# Patient Record
Sex: Female | Born: 1960
Health system: Southern US, Community
[De-identification: ages and names within clinical notes are randomized; demographics above are authoritative.]

## PROBLEM LIST (undated history)

## (undated) DIAGNOSIS — F39 Unspecified mood [affective] disorder: Secondary | ICD-10-CM

## (undated) DIAGNOSIS — F32A Depression, unspecified: Secondary | ICD-10-CM

## (undated) DIAGNOSIS — E049 Nontoxic goiter, unspecified: Secondary | ICD-10-CM

## (undated) HISTORY — DX: Unspecified mood (affective) disorder: F39

## (undated) HISTORY — DX: Nontoxic goiter, unspecified: E04.9

---

## 2000-09-14 ENCOUNTER — Other Ambulatory Visit: Admission: RE | Admit: 2000-09-14 | Discharge: 2000-09-14 | Payer: Self-pay | Admitting: Obstetrics and Gynecology

## 2002-02-22 ENCOUNTER — Encounter: Admission: RE | Admit: 2002-02-22 | Discharge: 2002-02-22 | Payer: Self-pay | Admitting: Internal Medicine

## 2002-02-22 ENCOUNTER — Encounter: Payer: Self-pay | Admitting: Internal Medicine

## 2003-12-16 HISTORY — PX: BUNIONECTOMY: SHX129

## 2004-05-03 ENCOUNTER — Encounter: Admission: RE | Admit: 2004-05-03 | Discharge: 2004-05-03 | Payer: Self-pay | Admitting: Endocrinology

## 2004-12-26 ENCOUNTER — Ambulatory Visit (HOSPITAL_BASED_OUTPATIENT_CLINIC_OR_DEPARTMENT_OTHER): Admission: RE | Admit: 2004-12-26 | Discharge: 2004-12-26 | Payer: Self-pay | Admitting: Orthopedic Surgery

## 2008-11-10 ENCOUNTER — Encounter: Admission: RE | Admit: 2008-11-10 | Discharge: 2008-11-10 | Payer: Self-pay | Admitting: Obstetrics & Gynecology

## 2008-12-20 ENCOUNTER — Encounter: Admission: RE | Admit: 2008-12-20 | Discharge: 2008-12-20 | Payer: Self-pay | Admitting: Cardiology

## 2008-12-22 HISTORY — PX: OTHER SURGICAL HISTORY: SHX169

## 2011-01-05 ENCOUNTER — Encounter: Payer: Self-pay | Admitting: Endocrinology

## 2011-05-02 NOTE — Op Note (Signed)
NAMEAMARISSA, KOERNER                ACCOUNT NO.:  [id number]   MEDICAL RECORD NO.:  [id number]          PATIENT TYPE:  AMB   LOCATION:  DSC                          FACILITY:  [facility]   PHYSICIAN:  Rodney A. 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   OPERATION:  Dome osteotomy base first metatarsal, realignment first  metatarsal; removal exostosis, great toe metatarsophalangeal joint, left  foot.   ANESTHESIA:  General.   SURGEON:  Rodney A. Chaney Malling, M.D.   PROCEDURE:  The patient was placed on the operating table in the supine  position.  A pneumatic tourniquet was placed on the left upper thigh.  The  left lower extremity was prepped with DuraPrep and draped out in the usual  manner.  The leg was wrapped with an Esmarch.  The tourniquet was elevated.  An incision was made over the great toe MP joint on the medial side.  Skin  edges were retracted.  Great care was taken to avoid injury to the  superficial cutaneous nerves.  A distally based flap of the capsule was  made, and the capsule was thinned out.  This exposed a very large exostosis.  The exostosis was removed with a power saw, and a rongeur was used to clean  the edges.  Excellent decompression of the exostosis was achieved.  The MP  joint was then irrigated with copious amounts of saline solution and removed  all the bone dust.   Attention was then turned to the base of the first metatarsal.  On the C-arm  magnification, the first metatarsal and the first cuneiform articulation was  identified and isolated.  The needle was put in the articulation.  The  proximal metaphyseal area of the first  metatarsal was then exposed.  Care  was taken to avoid injury to the superficial cutaneous nerves.  Using a dome  osteotomy saw, an osteotomy was made.  The first metatarsal was then brought  parallel to the second and third metatarsals and was held in this position,  and under C-arm control two large Steinmann pins were placed across the  osteotomy site to stabilize the osteotomy site.  Excellent realignment was  achieved.  The first metatarsal was now parallel to the second and third  metatarsals.  The exostosis which was removed previously had articular  cartilage removed.  This was ground up and used as a bone graft at the  osteotomy site.  The capsule of the great toe MP joint was then closed with  3-0 Vicryl.  4-0 nylon was then used to close the skin.  Sterile dressings  were applied.  The area of incision had been infiltrated with Marcaine.  A  large bulky pressure dressing was applied.  The patient returned to the  recovery room in excellent  condition.  Technically, this procedure went  extremely well.   DRAINS:  None.   COMPLICATIONS:  None.   I was very pleased with the surgical outcome.   DISPOSITION:  1.  To return to my office on Monday.  2.  Percocet for pain.  3.  Walking with a wooden shoe, partial weightbearing.       RAM/MEDQ  D:  12/26/2004  T:  12/26/2004  Job:  [id number]

## 2012-04-23 ENCOUNTER — Ambulatory Visit
Admission: RE | Admit: 2012-04-23 | Discharge: 2012-04-23 | Disposition: A | Discharge status: Home / Self Care | Payer: BC Managed Care – PPO | Source: Ambulatory Visit | Attending: Cardiology | Admitting: Cardiology

## 2012-04-23 ENCOUNTER — Other Ambulatory Visit: Payer: Self-pay | Admitting: Cardiology

## 2012-04-23 DIAGNOSIS — F17200 Nicotine dependence, unspecified, uncomplicated: Secondary | ICD-10-CM

## 2013-01-14 LAB — LIPID PANEL
Cholesterol: 244 — AB (ref 0–200)
HDL: 55 (ref 35–70)
LDL Cholesterol: 127
Triglycerides: 313 — AB (ref 40–160)

## 2013-01-14 LAB — BASIC METABOLIC PANEL: Glucose: 99

## 2013-02-25 IMAGING — CR DG CHEST 2V
2 series · 2 of 2 positions shown · non-contrast
Comparison: Chest x-ray of 12/20/2008

CLINICAL DATA: Smoking history, chronic cough

CHEST - 2 VIEW

[view not recorded (1 of 2)]
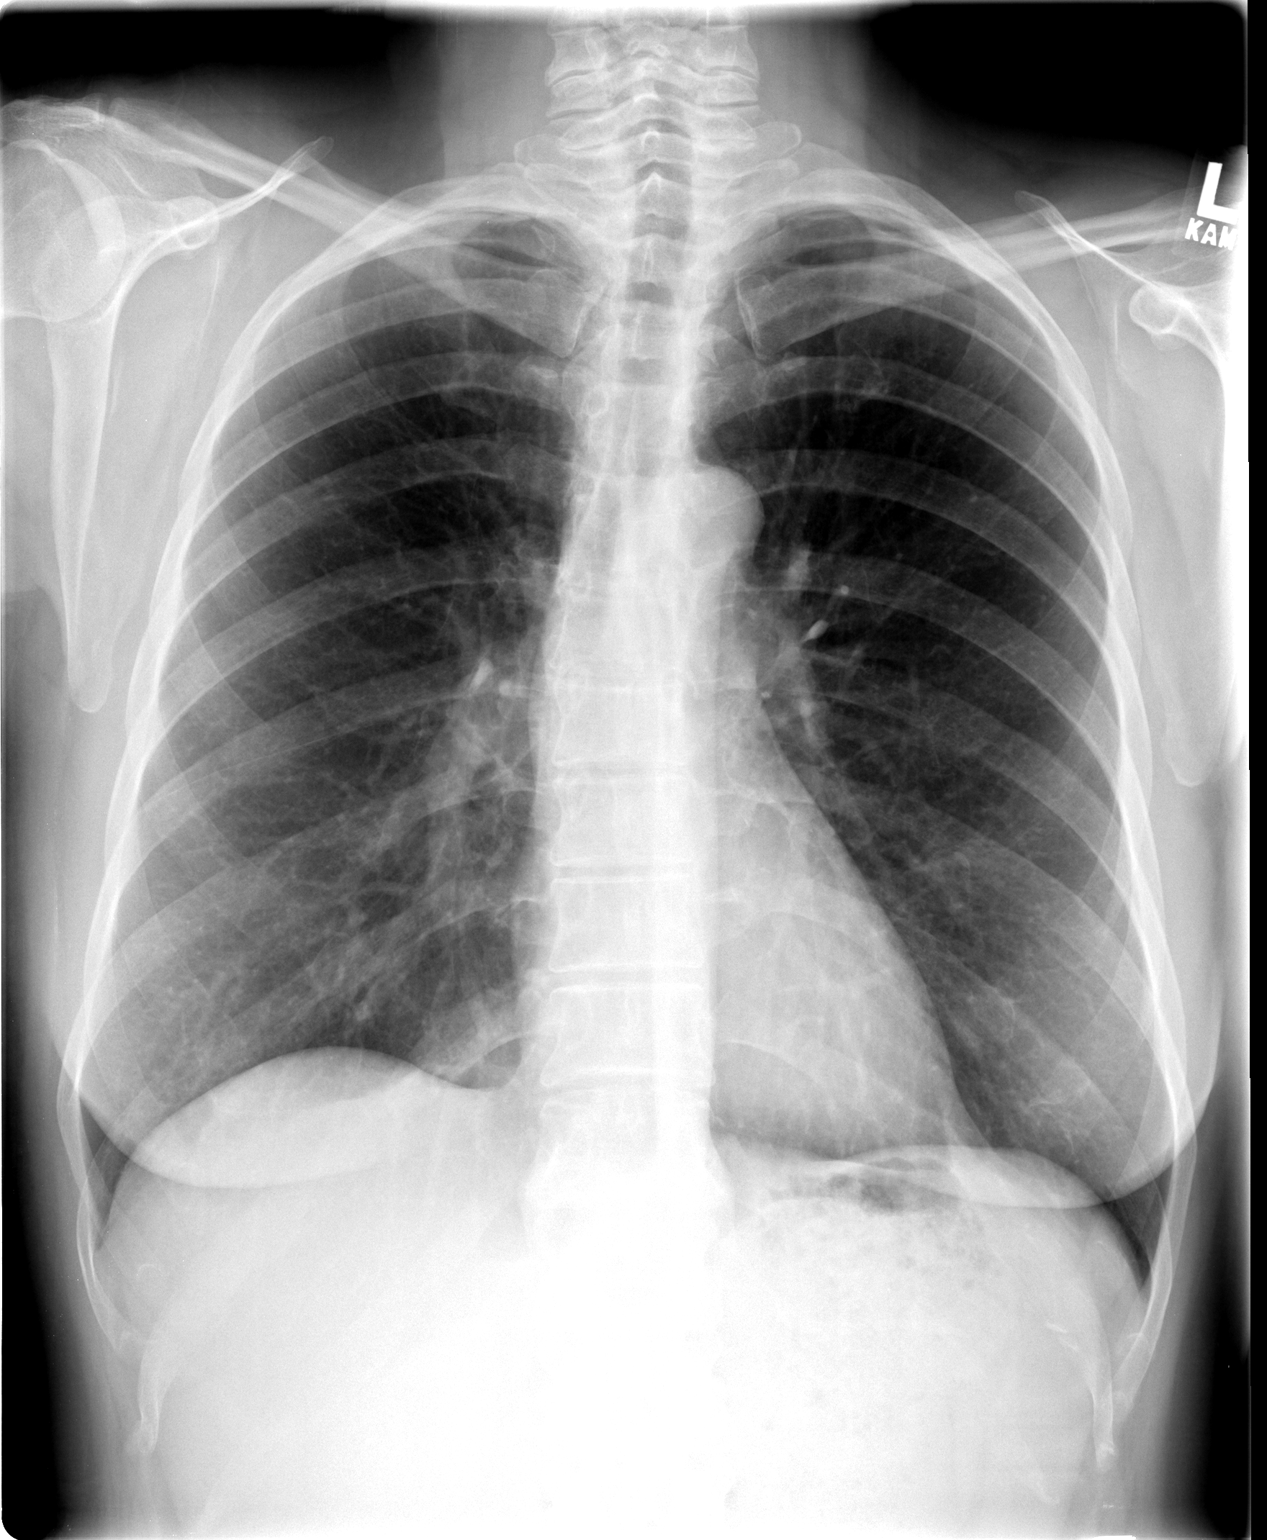

[view not recorded (2 of 2)]
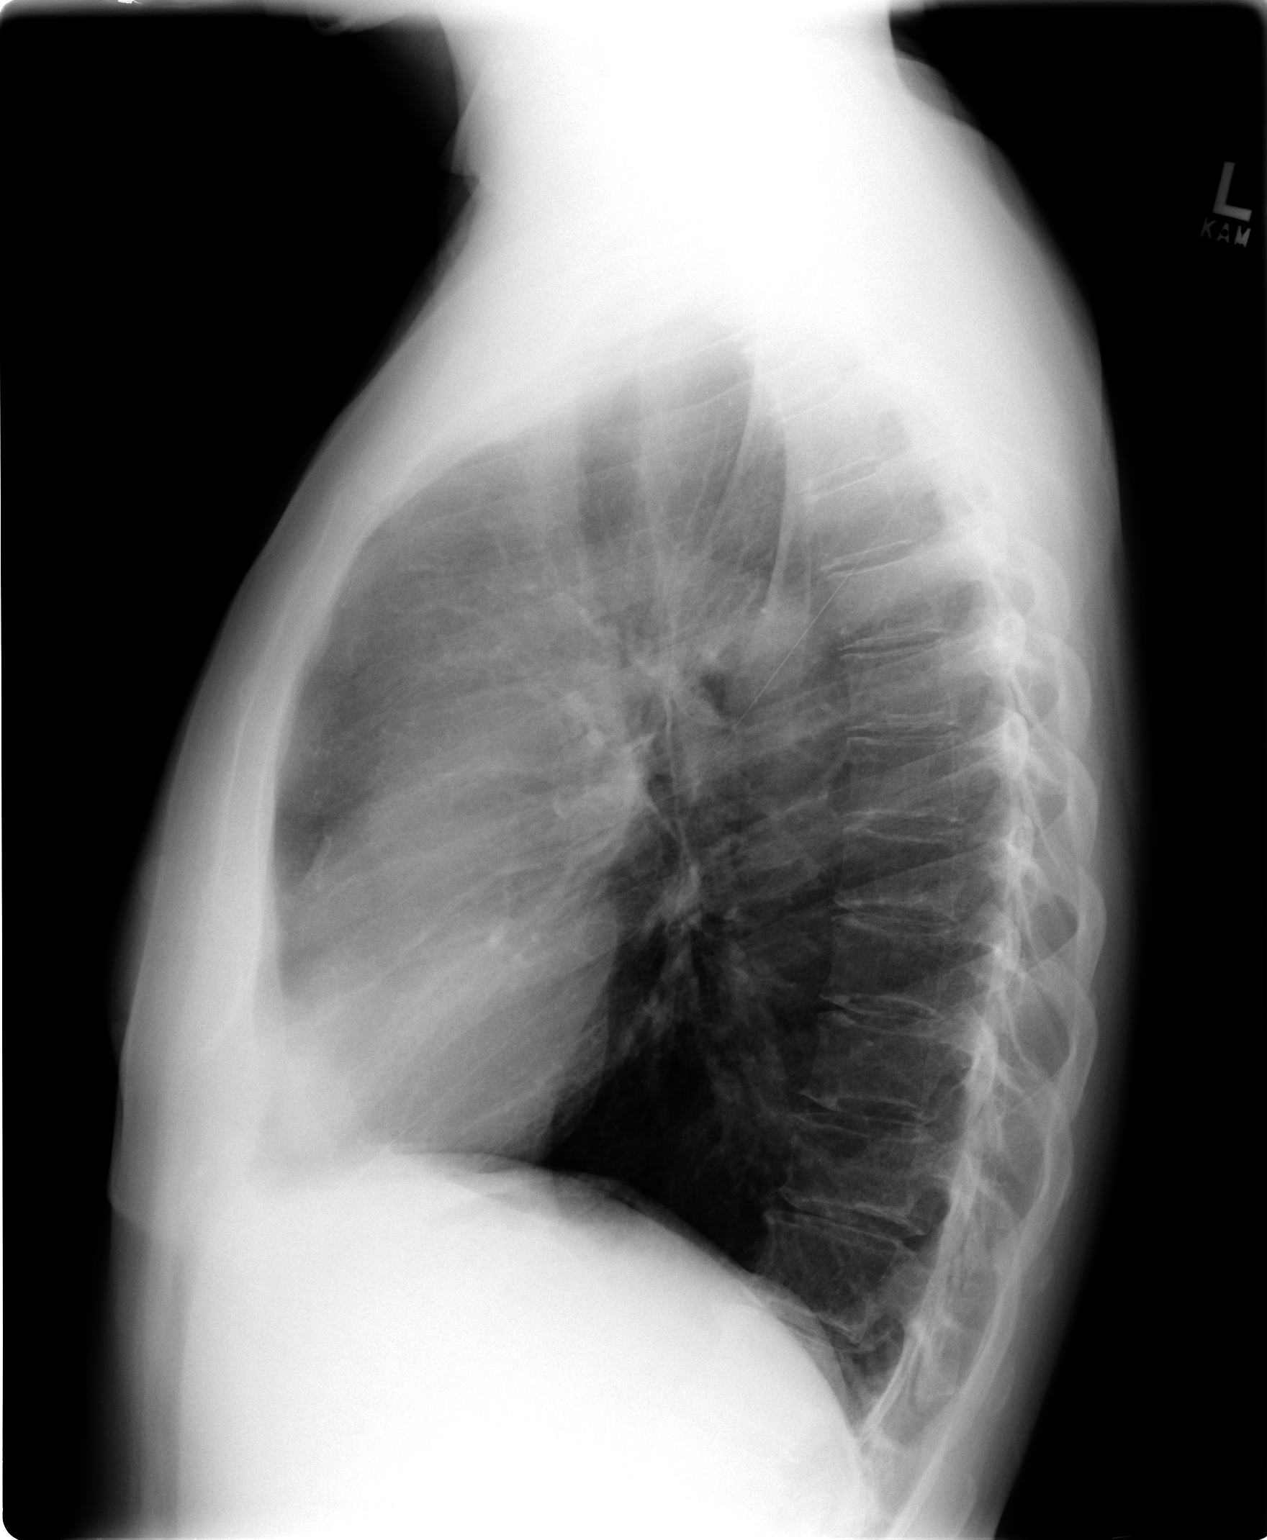

[2 of 2 positions shown; findings below may reference images not displayed]

FINDINGS: The lungs are clear but hyperaerated.  Mediastinal
contours appear stable.  The heart is within normal limits in size.
No bony abnormality is seen.
IMPRESSION: No active lung disease.  No change in slight hyperaeration.

## 2013-02-28 ENCOUNTER — Encounter: Payer: Self-pay | Admitting: *Deleted

## 2013-03-09 ENCOUNTER — Encounter: Payer: Self-pay | Admitting: Cardiology

## 2014-08-14 LAB — LIPID PANEL
Cholesterol: 205 — AB (ref 0–200)
HDL: 43 (ref 35–70)
LDL Cholesterol: 132
LDl/HDL Ratio: 4.8
Triglycerides: 148 (ref 40–160)

## 2014-08-14 LAB — HEMOGLOBIN A1C: HbA1c POC (<> result, manual entry): 5.4 % (ref 4.0–5.6)

## 2014-08-14 LAB — HEPATIC FUNCTION PANEL
ALT: 32 (ref 7–35)
AST: 21 (ref 13–35)
Alkaline Phosphatase: 61 (ref 25–125)
Bilirubin, Total: 0.4

## 2014-08-14 LAB — BASIC METABOLIC PANEL
BUN: 12 (ref 4–21)
Creatinine: 0.8 (ref 0.5–1.1)
Glucose: 101
Potassium: 4.4 (ref 3.4–5.3)
Sodium: 140 (ref 137–147)

## 2014-08-14 LAB — VITAMIN D 25 HYDROXY (VIT D DEFICIENCY, FRACTURES): Vit D, 25-Hydroxy: 55

## 2014-08-14 LAB — TSH: TSH: 1.19 (ref 0.41–5.90)

## 2015-06-13 LAB — LIPID PANEL
Cholesterol: 188 (ref 0–200)
HDL: 38 (ref 35–70)
LDL Cholesterol: 112
LDl/HDL Ratio: 4.9
Triglycerides: 215 — AB (ref 40–160)

## 2015-06-13 LAB — CBC AND DIFFERENTIAL
HCT: 42 (ref 36–46)
Hemoglobin: 14.1 (ref 12.0–16.0)
Neutrophils Absolute: 9
Platelets: 217 (ref 150–399)
WBC: 13

## 2015-06-13 LAB — BASIC METABOLIC PANEL
BUN: 10 (ref 4–21)
Creatinine: 0.8 (ref 0.5–1.1)
Glucose: 93
Potassium: 4.2 (ref 3.4–5.3)
Sodium: 140 (ref 137–147)

## 2015-06-13 LAB — HEPATIC FUNCTION PANEL
ALT: 24 (ref 7–35)
AST: 15 (ref 13–35)
Alkaline Phosphatase: 62 (ref 25–125)
Bilirubin, Total: 0.4

## 2015-06-13 LAB — VITAMIN D 25 HYDROXY (VIT D DEFICIENCY, FRACTURES): Vit D, 25-Hydroxy: 40

## 2015-06-13 LAB — HEMOGLOBIN A1C: Hemoglobin A1C: 5.3

## 2015-07-24 ENCOUNTER — Other Ambulatory Visit: Payer: Self-pay | Admitting: Gastroenterology

## 2015-07-24 DIAGNOSIS — R1013 Epigastric pain: Secondary | ICD-10-CM

## 2015-07-24 DIAGNOSIS — R52 Pain, unspecified: Secondary | ICD-10-CM

## 2015-07-25 ENCOUNTER — Other Ambulatory Visit: Payer: Self-pay | Admitting: Gastroenterology

## 2015-07-25 ENCOUNTER — Ambulatory Visit
Admission: RE | Admit: 2015-07-25 | Discharge: 2015-07-25 | Disposition: A | Discharge status: Home / Self Care | Payer: BLUE CROSS/BLUE SHIELD | Source: Ambulatory Visit | Attending: Gastroenterology | Admitting: Gastroenterology

## 2015-07-25 DIAGNOSIS — R1013 Epigastric pain: Secondary | ICD-10-CM

## 2015-07-26 ENCOUNTER — Other Ambulatory Visit: Payer: Self-pay | Admitting: Gastroenterology

## 2015-07-26 DIAGNOSIS — N289 Disorder of kidney and ureter, unspecified: Secondary | ICD-10-CM

## 2015-08-06 ENCOUNTER — Ambulatory Visit
Admission: RE | Admit: 2015-08-06 | Discharge: 2015-08-06 | Disposition: A | Discharge status: Home / Self Care | Payer: BLUE CROSS/BLUE SHIELD | Source: Ambulatory Visit | Attending: Gastroenterology | Admitting: Gastroenterology

## 2015-08-06 DIAGNOSIS — N289 Disorder of kidney and ureter, unspecified: Secondary | ICD-10-CM

## 2015-08-06 MED ORDER — GADOBENATE DIMEGLUMINE 529 MG/ML IV SOLN
17.0000 mL | Freq: Once | INTRAVENOUS | Status: AC | PRN
Start: 2015-08-06 — End: 2015-08-06
  Administered 2015-08-06: 17 mL via INTRAVENOUS

## 2016-04-25 LAB — BASIC METABOLIC PANEL
BUN: 13 (ref 4–21)
BUN: 13 (ref 4–21)
Creatinine: 0.8 (ref 0.5–1.1)
Creatinine: 0.8 (ref 0.5–1.1)
Glucose: 91
Glucose: 91
Potassium: 4.2 (ref 3.4–5.3)
Sodium: 139 (ref 137–147)
Sodium: 139 (ref 137–147)

## 2016-04-25 LAB — LIPID PANEL
Cholesterol: 195 (ref 0–200)
Cholesterol: 195 (ref 0–200)
HDL: 37 (ref 35–70)
HDL: 37 (ref 35–70)
LDL Cholesterol: 117
LDL Cholesterol: 117
LDl/HDL Ratio: 5.3
LDl/HDL Ratio: 5.3
Triglycerides: 205 — AB (ref 40–160)
Triglycerides: 205 — AB (ref 40–160)

## 2016-04-25 LAB — HEPATIC FUNCTION PANEL
ALT: 28 (ref 7–35)
ALT: 28 (ref 7–35)
AST: 18 (ref 13–35)
AST: 18 (ref 13–35)
Alkaline Phosphatase: 60 (ref 25–125)
Alkaline Phosphatase: 60 (ref 25–125)
Bilirubin, Total: 0.5
Bilirubin, Total: 0.5

## 2016-04-25 LAB — HEMOGLOBIN A1C
HbA1c POC (<> result, manual entry): 5.4 % (ref 4.0–5.6)
Hemoglobin A1C: 5.4

## 2016-04-25 LAB — VITAMIN D 25 HYDROXY (VIT D DEFICIENCY, FRACTURES): Vit D, 25-Hydroxy: 35

## 2016-04-25 LAB — TSH
TSH: 0.91 (ref 0.41–5.90)
TSH: 0.91 (ref 0.41–5.90)

## 2017-09-25 LAB — LIPID PANEL
Cholesterol: 180 (ref 0–200)
HDL: 31 — AB (ref 35–70)
LDL Cholesterol: 117
LDl/HDL Ratio: 3.5
Triglycerides: 198 — AB (ref 40–160)

## 2017-09-25 LAB — CBC AND DIFFERENTIAL
HCT: 42 (ref 36–46)
Hemoglobin: 14.3 (ref 12.0–16.0)
Neutrophils Absolute: 9
Platelets: 241 (ref 150–399)
WBC: 12.2

## 2017-09-25 LAB — HEPATIC FUNCTION PANEL
ALT: 26 (ref 7–35)
AST: 17 (ref 13–35)
Alkaline Phosphatase: 76 (ref 25–125)
Bilirubin, Total: 0.3

## 2017-09-25 LAB — VITAMIN D 25 HYDROXY (VIT D DEFICIENCY, FRACTURES): Vit D, 25-Hydroxy: 43.6

## 2017-09-25 LAB — TSH: TSH: 0.58 (ref 0.41–5.90)

## 2017-09-25 LAB — BASIC METABOLIC PANEL
BUN: 8 (ref 4–21)
Creatinine: 0.9 (ref 0.5–1.1)
Glucose: 101
Potassium: 4.4 (ref 3.4–5.3)
Sodium: 144 (ref 137–147)

## 2017-09-25 LAB — VITAMIN B12: Vitamin B-12: 2000

## 2018-10-05 ENCOUNTER — Encounter: Payer: Self-pay | Admitting: Family Medicine

## 2018-10-12 ENCOUNTER — Encounter: Payer: Self-pay | Admitting: Family Medicine

## 2018-10-12 NOTE — Progress Notes (Signed)
Care Everywhere/thx dmf 

## 2018-10-13 ENCOUNTER — Encounter: Payer: Self-pay | Admitting: Family Medicine

## 2018-10-13 ENCOUNTER — Telehealth: Payer: Self-pay | Admitting: Acute Care

## 2018-10-13 ENCOUNTER — Ambulatory Visit (INDEPENDENT_AMBULATORY_CARE_PROVIDER_SITE_OTHER): Payer: BLUE CROSS/BLUE SHIELD | Admitting: Family Medicine

## 2018-10-13 VITALS — BP 112/70 | HR 65 | Temp 98.6°F | Ht 67.0 in | Wt 173.0 lb

## 2018-10-13 DIAGNOSIS — E2839 Other primary ovarian failure: Secondary | ICD-10-CM

## 2018-10-13 DIAGNOSIS — Z6827 Body mass index (BMI) 27.0-27.9, adult: Secondary | ICD-10-CM

## 2018-10-13 DIAGNOSIS — M810 Age-related osteoporosis without current pathological fracture: Secondary | ICD-10-CM | POA: Diagnosis not present

## 2018-10-13 DIAGNOSIS — R3129 Other microscopic hematuria: Secondary | ICD-10-CM

## 2018-10-13 DIAGNOSIS — Z833 Family history of diabetes mellitus: Secondary | ICD-10-CM | POA: Diagnosis not present

## 2018-10-13 DIAGNOSIS — F17200 Nicotine dependence, unspecified, uncomplicated: Secondary | ICD-10-CM

## 2018-10-13 DIAGNOSIS — Z809 Family history of malignant neoplasm, unspecified: Secondary | ICD-10-CM

## 2018-10-13 LAB — COMPREHENSIVE METABOLIC PANEL
ALT: 30 U/L (ref 0–35)
AST: 16 U/L (ref 0–37)
Albumin: 4.1 g/dL (ref 3.5–5.2)
Alkaline Phosphatase: 59 U/L (ref 39–117)
BUN: 11 mg/dL (ref 6–23)
CO2: 28 mEq/L (ref 19–32)
Calcium: 9.2 mg/dL (ref 8.4–10.5)
Chloride: 104 mEq/L (ref 96–112)
Creatinine, Ser: 0.99 mg/dL (ref 0.40–1.20)
GFR: 61.51 mL/min (ref >60.00)
Glucose, Bld: 110 mg/dL — ABNORMAL HIGH (ref 70–99)
Potassium: 3.5 mEq/L (ref 3.5–5.1)
Sodium: 141 mEq/L (ref 135–145)
Total Bilirubin: 0.4 mg/dL (ref 0.2–1.2)
Total Protein: 6.4 g/dL (ref 6.0–8.3)

## 2018-10-13 LAB — LIPID PANEL
Cholesterol: 181 mg/dL (ref 0–200)
HDL: 40.6 mg/dL (ref >39.00)
NonHDL: 140.67
Total CHOL/HDL Ratio: 4
Triglycerides: 274 mg/dL — ABNORMAL HIGH (ref 0.0–149.0)
VLDL: 54.8 mg/dL — ABNORMAL HIGH (ref 0.0–40.0)

## 2018-10-13 LAB — URINALYSIS, MICROSCOPIC ONLY: RBC / HPF: NONE SEEN (ref 0–?)

## 2018-10-13 LAB — LDL CHOLESTEROL, DIRECT: Direct LDL: 117 mg/dL

## 2018-10-13 LAB — POCT GLYCOSYLATED HEMOGLOBIN (HGB A1C): HbA1c POC (<> result, manual entry): 5.7 % (ref 4.0–5.6)

## 2018-10-13 MED ORDER — VARENICLINE TARTRATE 0.5 MG X 11 & 1 MG X 42 PO MISC: ORAL | 0 refills | Status: DC

## 2018-10-13 NOTE — Assessment & Plan Note (Signed)
Has never had a DEXA. DEXA ordered- pt to call to schedule.

## 2018-10-13 NOTE — Addendum Note (Signed)
Addended by: Lerry Liner on: 10/13/2018 08:38 AM   Modules accepted: Orders

## 2018-10-13 NOTE — Assessment & Plan Note (Signed)
Microscopic UA today. If positive, refer to urology for cystoscopy. The patient indicates understanding of these issues and agrees with the plan.

## 2018-10-13 NOTE — Progress Notes (Signed)
Biometric Screening/thx dmf

## 2018-10-13 NOTE — Patient Instructions (Addendum)
Great to meet you.  Great to see you. I will call you with your lab results from today and you can view them online.   Please call the breast center at [phone] to schedule your DEXA.  We are referring to the lung cancer screening clinic.  Please sign request for recent PAP, Mammogram, and Bone Density Scan from Dr. Vance Gather office :)

## 2018-10-13 NOTE — Progress Notes (Signed)
[facility]/thx dmf

## 2018-10-13 NOTE — Assessment & Plan Note (Addendum)
Smoking cessation instruction/counseling given:  commended patient for quitting and reviewed strategies for preventing relapses.  She is more motivated to quit now that she has a 57 month old grandson. Chantix start pack rx given to pt She would like referral to lung cancer screening clinic- she is aware that her insurance may not cover this.

## 2018-10-13 NOTE — Assessment & Plan Note (Signed)
Labs today

## 2018-10-13 NOTE — Progress Notes (Signed)
Subjective:   Patient ID: [name], female    DOB: 1961-08-25, 57 y.o.   MRN: [id number]  JAMELYN BOVARD is a pleasant 57 y.o. year old female who presents to clinic today with New Patient (Initial Visit) (Patient is here today to establish care. Would like to go over family history and be pro-active as cancer runs in her family without a definition as all were metastatic by the time they were caught.  Agrees to get Tdap today.  She brought CXR which states Osteoporosis but is not a BMD scan.  She is also getting over a URI taking Prednisone and Proair and has put Phentermine on hold until over it.  Unsure if should get Tdap with this or wait.  Would like to have Phentermine taken over by you) and addendum (so that all care is in one area.  Her PCP Dr. Clarene Duke retired.  She sees Dr. Rana Snare for GYN and Mammogram which were done recently WNL.  Will sign for a copy of these to come to [place].  Would like to  try Chantix again for smoking cessation.)  on 10/13/2018  HPI:  Chart reviewed from Care everywhere- was previously seen by Dr. Dimple Casey at [facility].  H/o microscopic hematuria- Intermittent without any other symptoms.  Has GYN, Dr. Rana Snare. Per pt, mammogram UTD.  Long term smoker- 20 pack year history.  Has tried Chantix, Wellbutrin and nicotine lozenges in the past.  She would like to try Chantix again for smoking cessation.  Her family history is troubling. Her father and brother both died in their 26s from metastatic cancer of unknown cause. Her other brother died in his 74s and his sleep of unknown cause. Mother and maternal grandmother died in her late 9s, they were obese and has diabetes and hypertension and CAD. She has a daughter in her early 68s who is healthy.  Patient had a negative colonoscopy in 2013.   She does go to dermatologist yearly (Dr. Lovenia Kim).   Osteoporosis- brings in a CXR that indicates osteoporosis but has not had a DEXA.  URI- currently taking prednisone and proair  for this.  She does feel the cough is breaking up and she feels better.  She is asking for me to refill her phentermine that Dr. Rana Snare prescribes.  She says it helps with energy.  Current Outpatient Medications on File Prior to Visit  Medication Sig Dispense Refill  . albuterol (PROVENTIL HFA;VENTOLIN HFA) 108 (90 Base) MCG/ACT inhaler INHALE 1 PUFF PO Q 4 TO 6 H PRN  0  . predniSONE (DELTASONE) 10 MG tablet   0  . phentermine (ADIPEX-P) 37.5 MG tablet Take 1 tablet by mouth daily.  2   No current facility-administered medications on file prior to visit.     Allergies  Allergen Reactions  . Penicillins     Past Medical History:  Diagnosis Date  . Seasonal mood disorder (HCC)   . Thyroid goiter     Past Surgical History:  Procedure Laterality Date  . stress myoview  12/22/2008   EF 69%; LV norm.    Family History  Problem Relation Age of Onset  . Congestive Heart Failure Mother   . Coronary artery disease Mother   . Diabetes Mother   . Hypertension Mother   . Cancer Father   . Cancer Brother        Metastatic  . Coronary artery disease Maternal Grandmother   . Diabetes Maternal Grandmother   . Hypertension Maternal Grandmother   .  Early death Brother   . Drug abuse Brother     Social History   Socioeconomic History  . Marital status: Married    Spouse name: Not on file  . Number of children: Not on file  . Years of education: Not on file  . Highest education level: Not on file  Occupational History  . Not on file  Social Needs  . Financial resource strain: Not on file  . Food insecurity:    Worry: Not on file    Inability: Not on file  . Transportation needs:    Medical: Not on file    Non-medical: Not on file  Tobacco Use  . Smoking status: Current Every Day Smoker    Packs/day: 1.00    Types: Cigarettes  . Smokeless tobacco: Never Used  . Tobacco comment: chantix prescribed  04/12/2012  Substance and Sexual Activity  . Alcohol use: Yes    Comment:  Occas  . Drug use: Never  . Sexual activity: Not Currently  Lifestyle  . Physical activity:    Days per week: Not on file    Minutes per session: Not on file  . Stress: Not on file  Relationships  . Social connections:    Talks on phone: Not on file    Gets together: Not on file    Attends religious service: Not on file    Active member of club or organization: Not on file    Attends meetings of clubs or organizations: Not on file    Relationship status: Not on file  . Intimate partner violence:    Fear of current or ex partner: Not on file    Emotionally abused: Not on file    Physically abused: Not on file    Forced sexual activity: Not on file  Other Topics Concern  . Not on file  Social History Narrative  . Not on file   The PMH, PSH, Social History, Family History, Medications, and allergies have been reviewed in [facility], and have been updated if relevant.   Review of Systems  Constitutional: Negative.   HENT: Negative.   Eyes: Negative.   Respiratory: Positive for cough. Negative for apnea, choking, chest tightness, shortness of breath, wheezing and stridor.   Cardiovascular: Negative.   Gastrointestinal: Negative.   Endocrine: Negative.   Genitourinary: Negative.   Musculoskeletal: Negative.   Skin: Negative.   Allergic/Immunologic: Negative.   Neurological: Negative.   Hematological: Negative.   Psychiatric/Behavioral: Negative.   All other systems reviewed and are negative.      Objective:    BP 112/70 (BP Location: Left Arm, Patient Position: Sitting, Cuff Size: Normal)   Pulse 65   Temp 98.6 F (37 C) (Oral)   Ht 5\' 7"  (1.702 m)   Wt 173 lb (78.5 kg)   SpO2 95%   BMI 27.10 kg/m    Physical Exam  Constitutional: She is oriented to person, place, and time. She appears well-developed and well-nourished. No distress.  HENT:  Head: Normocephalic and atraumatic.  Eyes: EOM are normal.  Neck: Normal range of motion.  Pulmonary/Chest: Effort normal  and breath sounds normal.  Abdominal: Soft. Bowel sounds are normal.  Musculoskeletal: Normal range of motion. She exhibits no edema.  Neurological: She is alert and oriented to person, place, and time. No cranial nerve deficit.  Skin: Skin is warm and dry. She is not diaphoretic.  Psychiatric: She has a normal mood and affect. Her behavior is normal. Judgment and thought content normal.  Nursing note and vitals reviewed.          Assessment & Plan:   Microscopic hematuria  Smoker  Osteoporosis, unspecified osteoporosis type, unspecified pathological fracture presence No follow-ups on file.

## 2018-10-13 NOTE — Assessment & Plan Note (Signed)
Discussed importance of smoking cessation.  Unclear if possible exposure in her brother and dad- from working in Design[employer], fashion/clothing- ? Asbestos? Rubber? If she has any micropscopic hematuria, will refer to urology. The patient indicates understanding of these issues and agrees with the plan. CBC today as well.

## 2018-10-13 NOTE — Progress Notes (Signed)
Dr. Lowe/Thx dmf 

## 2018-10-13 NOTE — Assessment & Plan Note (Signed)
BMI is 27 so unfortunately I will not be prescribing phentermine to her.  Discussed given the risks, I am not comfortable prescribing it to someone with her BMI to use as something to give her energy.

## 2018-10-13 NOTE — Progress Notes (Signed)
Dr. Venia Minks dmf

## 2018-10-13 NOTE — Progress Notes (Unsigned)
Dr. Venia Minks dmf

## 2018-10-14 NOTE — Telephone Encounter (Signed)
LMTC x 1  

## 2018-10-15 ENCOUNTER — Ambulatory Visit
Admission: RE | Admit: 2018-10-15 | Discharge: 2018-10-15 | Disposition: A | Discharge status: Home / Self Care | Payer: BLUE CROSS/BLUE SHIELD | Source: Ambulatory Visit | Attending: Family Medicine | Admitting: Family Medicine

## 2018-10-15 DIAGNOSIS — E2839 Other primary ovarian failure: Secondary | ICD-10-CM

## 2018-10-18 NOTE — Telephone Encounter (Signed)
Will close this message and refer to referral notes 

## 2018-11-10 ENCOUNTER — Other Ambulatory Visit: Payer: Self-pay | Admitting: Acute Care

## 2018-11-10 DIAGNOSIS — F17210 Nicotine dependence, cigarettes, uncomplicated: Secondary | ICD-10-CM

## 2018-11-10 DIAGNOSIS — Z122 Encounter for screening for malignant neoplasm of respiratory organs: Secondary | ICD-10-CM

## 2018-12-06 ENCOUNTER — Ambulatory Visit
Admission: RE | Admit: 2018-12-06 | Discharge: 2018-12-06 | Disposition: A | Discharge status: Home / Self Care | Payer: BLUE CROSS/BLUE SHIELD | Source: Ambulatory Visit | Attending: Acute Care | Admitting: Acute Care

## 2018-12-06 ENCOUNTER — Ambulatory Visit: Payer: BLUE CROSS/BLUE SHIELD | Admitting: Acute Care

## 2018-12-06 ENCOUNTER — Encounter: Payer: Self-pay | Admitting: Acute Care

## 2018-12-06 VITALS — BP 112/78 | HR 85 | Ht 67.0 in | Wt 174.0 lb

## 2018-12-06 DIAGNOSIS — F17210 Nicotine dependence, cigarettes, uncomplicated: Secondary | ICD-10-CM

## 2018-12-06 DIAGNOSIS — Z122 Encounter for screening for malignant neoplasm of respiratory organs: Secondary | ICD-10-CM

## 2018-12-06 NOTE — Progress Notes (Signed)
Shared Decision Making Visit Lung Cancer Screening Program [phone]([id number])   Eligibility:  Age 57 y.o.  Pack Years Smoking History Calculation 40 pack year smoking history (# packs/per year x # years smoked)  Recent History of coughing up blood  no  Unexplained weight loss? no ( >Than 15 pounds within the last 6 months )  Prior History Lung / other cancer no (Diagnosis within the last 5 years already requiring surveillance chest CT Scans).  Smoking Status Current Smoker  Former Smokers: Years since quit: NA  Quit Date: NA  Visit Components:  Discussion included one or more decision making aids. yes  Discussion included risk/benefits of screening. yes  Discussion included potential follow up diagnostic testing for abnormal scans. yes  Discussion included meaning and risk of over diagnosis. yes  Discussion included meaning and risk of False Positives. yes  Discussion included meaning of total radiation exposure. yes  Counseling Included:  Importance of adherence to annual lung cancer LDCT screening. yes  Impact of comorbidities on ability to participate in the program. yes  Ability and willingness to under diagnostic treatment. yes  Smoking Cessation Counseling:  Current Smokers:   Discussed importance of smoking cessation. yes  Information about tobacco cessation classes and interventions provided to patient. yes  Patient provided with "ticket" for LDCT Scan. yes  Symptomatic Patient. no  Counseling  Diagnosis Code: Tobacco Use Z72.0  Asymptomatic Patient yes  Counseling (Intermediate counseling: > three minutes counseling) [id number]  Former Smokers:   Discussed the importance of maintaining cigarette abstinence. yes  Diagnosis Code: Personal History of Nicotine Dependence. W29.562Z87.891  Information about tobacco cessation classes and interventions provided to patient. Yes  Patient provided with "ticket" for LDCT Scan. yes  Written Order for Lung Cancer  Screening with LDCT placed in Epic. Yes (CT Chest Lung Cancer Screening Low Dose W/O CM) [id number] Z12.2-Screening of respiratory organs Z87.891-Personal history of nicotine dependence  I have spent 25 minutes of face to face time with Rachel Arnold discussing the risks and benefits of lung cancer screening. We viewed a power point together that explained in detail the above noted topics. We paused at intervals to allow for questions to be asked and answered to ensure understanding.We discussed that the single most powerful action that she can take to decrease her risk of developing lung cancer is to quit smoking. We discussed whether or not she is ready to commit to setting a quit date. We discussed options for tools to aid in quitting smoking including nicotine replacement therapy, non-nicotine medications, support groups, Quit Smart classes, and behavior modification. We discussed that often times setting smaller, more achievable goals, such as eliminating 1 cigarette a day for a week and then 2 cigarettes a day for a week can be helpful in slowly decreasing the number of cigarettes smoked. This allows for a sense of accomplishment as well as providing a clinical benefit. I gave her the " Be Stronger Than Your Excuses" card with contact information for community resources, classes, free nicotine replacement therapy, and access to mobile apps, text messaging, and on-line smoking cessation help. I have also given her my card and contact information in the event She needs to contact me. We discussed the time and location of the scan, and that either Abigail Miyamotoenise Phelps RN or I will call with the results within 24-48 hours of receiving them. I have offered her  a copy of the power point we viewed  as a resource in the event they need reinforcement of  the concepts we discussed today in the office. The patient verbalized understanding of all of  the above and had no further questions upon leaving the office. They have my  contact information in the event they have any further questions.  I spent 4 minutes counseling on smoking cessation and the health risks of continued tobacco abuse.  I explained to the patient that there has been a high incidence of coronary artery disease noted on these exams. I explained that this is a non-gated exam therefore degree or severity cannot be determined. This patient is not currently on statin therapy. I have asked the patient to follow-up with their PCP regarding any incidental finding of coronary artery disease and management with diet or medication as their PCP  feels is clinically indicated. The patient verbalized understanding of the above and had no further questions upon completion of the visit.      Rachel NgoSarah F Groce, NP 12/06/2018 3:28 PM

## 2018-12-16 ENCOUNTER — Telehealth: Payer: Self-pay | Admitting: Acute Care

## 2018-12-16 NOTE — Telephone Encounter (Signed)
I wanted to make sure you were aware of the incidental findings on Rachel Arnold's Low Dose CT for Lung Cancer screening. Lung RADS 1, negative study: no nodules or definitely benign nodules. Radiology recommendation is for a repeat LDCT in 12 months. We will order and schedule her annual screening for 11/2019.   There was notation of a Partially calcified right-sided thyroid nodule.You may have already worked this up, but if not recommendation is for [place] follow up per you clinical opinion as you know her clinical history. Additionally there was notation of mild hepatic steatosis.  Please don't hesitate to let me know if you have any questions or concerns. Thanks so much  IMPRESSION: 1. Lung-RADS 1, negative. Continue annual screening with low-dose chest CT without contrast in 12 months. 2. Aortic atherosclerosis (ICD10-I70.0) and emphysema (ICD10-J43.9). 3. Partially calcified right-sided thyroid nodule. Per consensus criteria, this warrants further evaluation with ultrasound. This follows ACR consensus guidelines: Managing Incidental Thyroid Nodules Detected on Imaging: White Paper of the ACR Incidental Thyroid Findings Committee. J Am Coll Radiol 2015; 12:143-150. 4. Mild hepatic steatosis.

## 2018-12-16 NOTE — Telephone Encounter (Signed)
Please schedule pt for follow up appt next week to discuss.

## 2018-12-17 ENCOUNTER — Other Ambulatory Visit: Payer: Self-pay | Admitting: Acute Care

## 2018-12-17 ENCOUNTER — Other Ambulatory Visit: Payer: Self-pay | Admitting: *Deleted

## 2018-12-17 DIAGNOSIS — F17210 Nicotine dependence, cigarettes, uncomplicated: Secondary | ICD-10-CM

## 2018-12-17 DIAGNOSIS — Z122 Encounter for screening for malignant neoplasm of respiratory organs: Secondary | ICD-10-CM

## 2018-12-20 NOTE — Telephone Encounter (Signed)
Please see below.  Has she been scheduled?

## 2018-12-20 NOTE — Telephone Encounter (Signed)
Are you scheduling the patient next week for follow up with you? Thanks

## 2018-12-22 NOTE — Telephone Encounter (Signed)
Thanks so much. 

## 2018-12-22 NOTE — Telephone Encounter (Signed)
She is scheduled for a F/U on 1.20.20 @ 11am/thx dmf

## 2019-01-03 ENCOUNTER — Ambulatory Visit (INDEPENDENT_AMBULATORY_CARE_PROVIDER_SITE_OTHER): Payer: BLUE CROSS/BLUE SHIELD | Admitting: Family Medicine

## 2019-01-03 VITALS — BP 104/74 | HR 77 | Temp 97.5°F | Ht 67.0 in | Wt 169.4 lb

## 2019-01-03 DIAGNOSIS — I700 Atherosclerosis of aorta: Secondary | ICD-10-CM | POA: Diagnosis not present

## 2019-01-03 DIAGNOSIS — F17200 Nicotine dependence, unspecified, uncomplicated: Secondary | ICD-10-CM

## 2019-01-03 DIAGNOSIS — J3489 Other specified disorders of nose and nasal sinuses: Secondary | ICD-10-CM | POA: Insufficient documentation

## 2019-01-03 DIAGNOSIS — E041 Nontoxic single thyroid nodule: Secondary | ICD-10-CM | POA: Diagnosis not present

## 2019-01-03 LAB — T4, FREE: Free T4: 0.96 ng/dL (ref 0.60–1.60)

## 2019-01-03 LAB — TSH: TSH: 0.87 u[IU]/mL (ref 0.35–4.50)

## 2019-01-03 LAB — T3, FREE: T3, Free: 3.3 pg/mL (ref 2.3–4.2)

## 2019-01-03 NOTE — Assessment & Plan Note (Signed)
>  25 minutes spent in face to face time with patient, >50% spent in counselling or coordination of care discussing CT results including thyroid nodule, aortic atherosclerosis, smoking, nasal lesions. She is not quite ready to quit smoking.  Rather than having an thyroid US , she would prefer to be referred to ENT for thyroid US/biopsy and for further evaluation of nasal lesions which is a reasonable plan. Order placed.  Thyroid labs today.

## 2019-01-03 NOTE — Patient Instructions (Signed)
Great to see you. Happy Birthday!!!   We will call you with ENT appointment.  I will call you with your lab results from today and you can view them online.

## 2019-01-03 NOTE — Progress Notes (Signed)
Subjective:   Patient ID: Rachel Arnold, female    DOB: [birth year], 58 y.o.    MRN: [id number]  Rachel Arnold is a pleasant 58 y.o. year old female who presents to clinic today with Discuss Results (Patient is here today to go over CT results from 12.23.2019. Dr. Rana Snare did a lab test for thyroid and said results were negative.) and Nose Issue (She states that she has to take Q-Tips and dig the inside distal skin bilaterally in nares to get anything out and feels like there is scabbing.  She does not have an ENT.)  on 01/03/2019  HPI:   Here to discuss CT results.   Had CT of chest for lung cancer screening on 12/06/18- Reviewed. CT neg fo lung CA, advised to repeat in 1 year for lung CA screening.  There was noted CAD on the scan.- aortic arthrosclerosis.  Lab Results  Component Value Date   CHOL 181 10/13/2018   HDL 40.60 10/13/2018   LDLCALC 117 09/25/2017   LDLDIRECT 117.0 10/13/2018   TRIG 274.0 (H) 10/13/2018   CHOLHDL 4 10/13/2018     The 10-year ASCVD risk score Denman George DC Jr., et al., 2013) is: 4.1%   Values used to calculate the score:     Age: 58 years     Sex: Female     Is Non-Hispanic African American: No     Diabetic: No     Tobacco smoker: Yes     Systolic Blood Pressure: 104 mmHg     Is BP treated: No     HDL Cholesterol: 40.6 mg/dL     Total Cholesterol: 181 mg/dL  Ct Chest Lung Ca Screen Low Dose W/o Cm  Result Date: 12/09/2018 CLINICAL DATA:  Forty pack-year smoking history. Current smoker. History skin cancer. EXAM: CT CHEST WITHOUT CONTRAST LOW-DOSE FOR LUNG CANCER SCREENING TECHNIQUE: Multidetector CT imaging of the chest was performed following the standard protocol without IV contrast. COMPARISON:  Chest radiograph of 04/23/2012.  No prior CT. FINDINGS: Cardiovascular: Aortic atherosclerosis. Normal heart size, without pericardial effusion. Mediastinum/Nodes: A partially calcified right-sided thyroid nodule measures 1.6 cm on image 3/2. No  mediastinal or definite hilar adenopathy, given limitations of unenhanced CT. Lungs/Pleura: No pleural fluid. Mild centrilobular emphysema. Bibasilar scarring or subsegmental atelectasis. No suspicious pulmonary nodule or mass. Upper Abdomen: Mild hepatic steatosis. Normal imaged portions of the spleen, stomach, pancreas, adrenal glands, kidneys. Musculoskeletal: Mild thoracic spondylosis. IMPRESSION: 1. Lung-RADS 1, negative. Continue annual screening with low-dose chest CT without contrast in 12 months. 2. Aortic atherosclerosis (ICD10-I70.0) and emphysema (ICD10-J43.9). 3. Partially calcified right-sided thyroid nodule. Per consensus criteria, this warrants further evaluation with ultrasound. This follows ACR consensus guidelines: Managing Incidental Thyroid Nodules Detected on Imaging: White Paper of the ACR Incidental Thyroid Findings Committee. J Am Coll Radiol 2015; 12:143-150. 4. Mild hepatic steatosis. Electronically Signed   By: Jeronimo Greaves M.D.   On: 12/09/2018 08:20   Findings showed right thyroid nodule- radiology advised ultrasound for further evaluation.  Lab Results  Component Value Date   TSH 0.58 09/25/2017   She has also had these bilateral recurrent nasal lesions in her noses that scab up and make it difficult to breath, for years. Current Outpatient Medications on File Prior to Visit  Medication Sig Dispense Refill  . phentermine (ADIPEX-P) 37.5 MG tablet Take 1 tablet by mouth daily.  2   No current facility-administered medications on file prior to visit.     Allergies  Allergen Reactions  .  Penicillins     Past Medical History:  Diagnosis Date  . Seasonal mood disorder (HCC)   . Thyroid goiter     Past Surgical History:  Procedure Laterality Date  . stress myoview  12/22/2008   EF 69%; LV norm.    Family History  Problem Relation Age of Onset  . Congestive Heart Failure Mother   . Coronary artery disease Mother   . Diabetes Mother   . Hypertension Mother     . Cancer Father   . Cancer Brother        Metastatic  . Coronary artery disease Maternal Grandmother   . Diabetes Maternal Grandmother   . Hypertension Maternal Grandmother   . Early death Brother   . Drug abuse Brother     Social History   Socioeconomic History  . Marital status: Married    Spouse name: Not on file  . Number of children: Not on file  . Years of education: Not on file  . Highest education level: Not on file  Occupational History  . Not on file  Social Needs  . Financial resource strain: Not on file  . Food insecurity:    Worry: Not on file    Inability: Not on file  . Transportation needs:    Medical: Not on file    Non-medical: Not on file  Tobacco Use  . Smoking status: Current Every Day Smoker    Packs/day: 2.00    Years: 20.00    Pack years: 40.00    Types: Cigarettes  . Smokeless tobacco: Never Used  . Tobacco comment: chantix prescribed  04/12/2012  Substance and Sexual Activity  . Alcohol use: Yes    Comment: Occas  . Drug use: Never  . Sexual activity: Not Currently  Lifestyle  . Physical activity:    Days per week: Not on file    Minutes per session: Not on file  . Stress: Not on file  Relationships  . Social connections:    Talks on phone: Not on file    Gets together: Not on file    Attends religious service: Not on file    Active member of club or organization: Not on file    Attends meetings of clubs or organizations: Not on file    Relationship status: Not on file  . Intimate partner violence:    Fear of current or ex partner: Not on file    Emotionally abused: Not on file    Physically abused: Not on file    Forced sexual activity: Not on file  Other Topics Concern  . Not on file  Social History Narrative  . Not on file   The PMH, PSH, Social History, Family History, Medications, and allergies have been reviewed in [facility], and have been updated if relevant.  Review of Systems  Constitutional: Negative.   HENT: Negative.    Cardiovascular: Negative.   Gastrointestinal: Negative.   Endocrine: Negative.   Genitourinary: Negative.   Musculoskeletal: Negative.   Allergic/Immunologic: Negative.   Neurological: Negative.   Hematological: Negative.   Psychiatric/Behavioral: Negative.   All other systems reviewed and are negative.      Objective:    BP 104/74 (BP Location: Left Arm, Patient Position: Sitting, Cuff Size: Normal)   Pulse 77   Temp (!) 97.5 F (36.4 C) (Oral)   Ht 5\' 7"  (1.702 m)   Wt 169 lb 6.4 oz (76.8 kg)   SpO2 94%   BMI 26.53 kg/m  Physical Exam    General:  Well-developed,well-nourished,in no acute distress; alert,appropriate and cooperative throughout examination Head:  normocephalic and atraumatic.   Eyes:  vision grossly intact, PERRL Ears:  R ear normal and L ear normal externally, TMs clear bilaterally Nose:  no external deformity.   Mouth:  good dentition.   Neck:  No deformities, masses, or tenderness noted. Lungs:  Normal respiratory effort, chest expands symmetrically. Lungs are clear to auscultation, no crackles or wheezes. Heart:  Normal rate and regular rhythm. S1 and S2 normal without gallop, murmur, click, rub or other extra sounds. Abdomen:  Bowel sounds positive,abdomen soft and non-tender without masses, organomegaly or hernias noted. Msk:  No deformity or scoliosis noted of thoracic or lumbar spine.   Extremities:  No clubbing, cyanosis, edema, or deformity noted with normal full range of motion of all joints.   Neurologic:  alert & oriented X3 and gait normal.   Skin:  Intact without suspicious lesions or rashes Cervical Nodes:  No lymphadenopathy noted Axillary Nodes:  No palpable lymphadenopathy Psych:  Cognition and judgment appear intact. Alert and cooperative with normal attention span and concentration. No apparent delusions, illusions, hallucinations      Assessment & Plan:   Smoker  Right thyroid nodule - Plan: TSH, T4, free, T3, free,  Ambulatory referral to ENT, CANCELED: US Soft Tissue Head/Neck, CANCELED: TSH, CANCELED: T4, free, CANCELED: T3  Aortic atherosclerosis (HCC)  Nasal lesion - Plan: Ambulatory referral to ENT No follow-ups on file.

## 2019-02-10 ENCOUNTER — Encounter: Payer: Self-pay | Admitting: Family Medicine

## 2019-02-10 ENCOUNTER — Ambulatory Visit: Payer: Self-pay | Admitting: *Deleted

## 2019-02-10 ENCOUNTER — Ambulatory Visit (INDEPENDENT_AMBULATORY_CARE_PROVIDER_SITE_OTHER): Payer: BLUE CROSS/BLUE SHIELD | Admitting: Family Medicine

## 2019-02-10 VITALS — BP 118/72 | HR 83 | Temp 98.3°F | Ht 67.0 in | Wt 166.0 lb

## 2019-02-10 DIAGNOSIS — J209 Acute bronchitis, unspecified: Secondary | ICD-10-CM | POA: Diagnosis not present

## 2019-02-10 MED ORDER — PREDNISONE 20 MG PO TABS: 40.0000 mg | ORAL_TABLET | Freq: Every day | ORAL | 0 refills | Status: AC

## 2019-02-10 MED ORDER — AZITHROMYCIN 500 MG PO TABS: 500.0000 mg | ORAL_TABLET | Freq: Every day | ORAL | 0 refills | Status: AC

## 2019-02-10 NOTE — Progress Notes (Signed)
Rachel Arnold - 58 y.o. female MRN [id number]  Date of birth: [birth year]  Subjective Chief Complaint  Patient presents with  . Cough    ongoing for one week-admits to being productive. Denies fevers or body aches.     HPI Rachel Arnold is a 58 y.o. female who complains of coryza, congestion, sore throat, productive cough, headache, fever and mild-moderate shortness of breath during cough and mild, mild-moderate wheezing at rest for 7-8 days.  Symptoms worsening over the past couple of days.  She denies a history of chest pain, myalgias, nausea and vomiting and denies a history of asthma. Patient does smoke cigarettes.  She has tried albuterol inhaler without much relief.   ROS:  A comprehensive ROS was completed and negative except as noted per HPI       Allergies  Allergen Reactions  . Penicillins     Past Medical History:  Diagnosis Date  . Seasonal mood disorder (HCC)   . Thyroid goiter     Past Surgical History:  Procedure Laterality Date  . stress myoview  12/22/2008   EF 69%; LV norm.    Social History   Socioeconomic History  . Marital status: Married    Spouse name: Not on file  . Number of children: Not on file  . Years of education: Not on file  . Highest education level: Not on file  Occupational History  . Not on file  Social Needs  . Financial resource strain: Not on file  . Food insecurity:    Worry: Not on file    Inability: Not on file  . Transportation needs:    Medical: Not on file    Non-medical: Not on file  Tobacco Use  . Smoking status: Current Every Day Smoker    Packs/day: 2.00    Years: 20.00    Pack years: 40.00    Types: Cigarettes  . Smokeless tobacco: Never Used  . Tobacco comment: chantix prescribed  04/12/2012  Substance and Sexual Activity  . Alcohol use: Yes    Comment: Occas  . Drug use: Never  . Sexual activity: Not Currently  Lifestyle  . Physical activity:    Days per week: Not on file    Minutes per session: Not  on file  . Stress: Not on file  Relationships  . Social connections:    Talks on phone: Not on file    Gets together: Not on file    Attends religious service: Not on file    Active member of club or organization: Not on file    Attends meetings of clubs or organizations: Not on file    Relationship status: Not on file  Other Topics Concern  . Not on file  Social History Narrative  . Not on file    Family History  Problem Relation Age of Onset  . Congestive Heart Failure Mother   . Coronary artery disease Mother   . Diabetes Mother   . Hypertension Mother   . Cancer Father   . Cancer Brother        Metastatic  . Coronary artery disease Maternal Grandmother   . Diabetes Maternal Grandmother   . Hypertension Maternal Grandmother   . Early death Brother   . Drug abuse Brother     Health Maintenance  Topic Date Due  . Hepatitis C Screening  10/14/2019 (Originally Dec 21, 1960)  . HIV Screening  10/14/2019 (Originally 01/26/1977)  . MAMMOGRAM  09/13/2020  . PAP SMEAR-Modifier  09/13/2021  .  COLONOSCOPY  01/15/2022  . TETANUS/TDAP  06/12/2025  . INFLUENZA VACCINE  Completed    ----------------------------------------------------------------------------------------------------------------------------------------------------------------------------------------------------------------- Physical Exam BP 118/72   Pulse 83   Temp 98.3 F (36.8 C) (Oral)   Ht 5\' 7"  (1.702 m)   Wt 166 lb (75.3 kg)   SpO2 94%   BMI 26.00 kg/m   Physical Exam Constitutional:      Appearance: Normal appearance.  HENT:     Head: Normocephalic and atraumatic.     Right Ear: Tympanic membrane normal.     Left Ear: Tympanic membrane normal.     Mouth/Throat:     Mouth: Mucous membranes are moist.  Eyes:     General: No scleral icterus. Neck:     Musculoskeletal: Neck supple.  Cardiovascular:     Rate and Rhythm: Normal rate and regular rhythm.  Pulmonary:     Effort: Pulmonary effort is  normal.     Breath sounds: Normal breath sounds.  Skin:    General: Skin is warm and dry.  Neurological:     General: No focal deficit present.     Mental Status: She is alert.  Psychiatric:        Mood and Affect: Mood normal.        Behavior: Behavior normal.     ------------------------------------------------------------------------------------------------------------------------------------------------------------------------------------------------------------------- Assessment and Plan  Acute bronchitis -Expiratory wheezing with worsening of symptoms over the past couple of days.  -She will continue albuterol and I'll add azithromycin and prednisone. -Recommend smoking cessation.  -Increased fluids and humidified are recommended.  -Discussed f/u if this continues to worsen or is not improving as expected.

## 2019-02-10 NOTE — Assessment & Plan Note (Addendum)
-  Expiratory wheezing with worsening of symptoms over the past couple of days.  -She will continue albuterol and I'll add azithromycin and prednisone. -Recommend smoking cessation.  -Increased fluids and humidified are recommended.  -Discussed f/u if this continues to worsen or is not improving as expected.

## 2019-02-10 NOTE — Telephone Encounter (Signed)
Fever- 100.1, chest congestion wirh breathing problems  Reason for Disposition . [1] MILD difficulty breathing (e.g., minimal/no SOB at rest, SOB with walking, pulse <100) AND [2] NEW-onset or WORSE than normal  Answer Assessment - Initial Assessment Questions 1. RESPIRATORY STATUS: "Describe your breathing?" (e.g., wheezing, shortness of breath, unable to speak, severe coughing)      Patient feels like she is not getting enough air in 2. ONSET: "When did this breathing problem begin?"      1 week ago 3. PATTERN "Does the difficult breathing come and go, or has it been constant since it started?"      Depends on activity- exertion makes it worse- resting makes cough worse 4. SEVERITY: "How bad is your breathing?" (e.g., mild, moderate, severe)    - MILD: No SOB at rest, mild SOB with walking, speaks normally in sentences, can lay down, no retractions, pulse < 100.    - MODERATE: SOB at rest, SOB with minimal exertion and prefers to sit, cannot lie down flat, speaks in phrases, mild retractions, audible wheezing, pulse 100-120.    - SEVERE: Very SOB at rest, speaks in single words, struggling to breathe, sitting hunched forward, retractions, pulse > 120      mild 5. RECURRENT SYMPTOM: "Have you had difficulty breathing before?" If so, ask: "When was the last time?" and "What happened that time?"      Not this bad- inhaler given 6. CARDIAC HISTORY: "Do you have any history of heart disease?" (e.g., heart attack, angina, bypass surgery, angioplasty)      no 7. LUNG HISTORY: "Do you have any history of lung disease?"  (e.g., pulmonary embolus, asthma, emphysema)     No- CT scan normal 8. CAUSE: "What do you think is causing the breathing problem?"      Chest congestion and sinus congestion 9. OTHER SYMPTOMS: "Do you have any other symptoms? (e.g., dizziness, runny nose, cough, chest pain, fever)     Fever- 100.1 ( not consistent), cough- yellow/greenish mucus, runny nose, chest pain 10.  PREGNANCY: "Is there any chance you are pregnant?" "When was your last menstrual period?"       n/a 11. TRAVEL: "Have you traveled out of the country in the last month?" (e.g., travel history, exposures)       Yes- 2/7 [place]  Protocols used: BREATHING DIFFICULTY-A-AH

## 2019-02-10 NOTE — Patient Instructions (Signed)
Acute Bronchitis, Adult Acute bronchitis is when air tubes (bronchi) in the lungs suddenly get swollen. The condition can make it hard to breathe. It can also cause these symptoms:  A cough.  Coughing up clear, yellow, or green mucus.  Wheezing.  Chest congestion.  Shortness of breath.  A fever.  Body aches.  Chills.  A sore throat. Follow these instructions at home:  Medicines  Take over-the-counter and prescription medicines only as told by your doctor.  If you were prescribed an antibiotic medicine, take it as told by your doctor. Do not stop taking the antibiotic even if you start to feel better. General instructions  Rest.  Drink enough fluids to keep your pee (urine) pale yellow.  Avoid smoking and secondhand smoke. If you smoke and you need help quitting, ask your doctor. Quitting will help your lungs heal faster.  Use an inhaler, cool mist vaporizer, or humidifier as told by your doctor.  Keep all follow-up visits as told by your doctor. This is important. How is this prevented? To lower your risk of getting this condition again:  Wash your hands often with soap and water. If you cannot use soap and water, use hand sanitizer.  Avoid contact with people who have cold symptoms.  Try not to touch your hands to your mouth, nose, or eyes.  Make sure to get the flu shot every year. Contact a doctor if:  Your symptoms do not get better in 2 weeks. Get help right away if:  You cough up blood.  You have chest pain.  You have very bad shortness of breath.  You become dehydrated.  You faint (pass out) or keep feeling like you are going to pass out.  You keep throwing up (vomiting).  You have a very bad headache.  Your fever or chills gets worse. This information is not intended to replace advice given to you by your health care provider. Make sure you discuss any questions you have with your health care provider. Document Released: 05/19/2008 Document  Revised: 07/15/2017 Document Reviewed: 05/21/2016 Elsevier Interactive Patient Education  2019 [employer] Inc[employer]  

## 2020-04-19 ENCOUNTER — Ambulatory Visit: Payer: BLUE CROSS/BLUE SHIELD | Admitting: Nurse Practitioner

## 2020-04-25 ENCOUNTER — Ambulatory Visit: Payer: BLUE CROSS/BLUE SHIELD | Admitting: Nurse Practitioner

## 2020-05-02 ENCOUNTER — Telehealth: Payer: Self-pay

## 2020-05-02 ENCOUNTER — Other Ambulatory Visit: Payer: Self-pay

## 2020-05-02 NOTE — Telephone Encounter (Signed)
Patient called back to answer prescreening questions and has been to [place] on 4/30 - 5/10. Patient answered no to all of the covid screening questions and had a negative covid test on 5/9. Also patient stated that she is fully vaccinated. Patient appointment is tomorrow @ 1015 in office. Does this need to be changed to a mychart visit. Pls advise. CB is [phone].

## 2020-05-02 NOTE — Telephone Encounter (Signed)
Charlotte please advise.  Should pt be virtual instead of in office due to travel? This is tomorrow 05/03/20 @ 10:15 pt.

## 2020-05-03 ENCOUNTER — Ambulatory Visit: Payer: Self-pay | Admitting: Nurse Practitioner

## 2020-05-03 NOTE — Telephone Encounter (Signed)
She needs to quarantine at least 10days prior to F2F appt, so change to virtual appt

## 2020-05-03 NOTE — Telephone Encounter (Signed)
Left pt a voicemail that we will wait on labs till her appointment so she knows what to order. Pt was told to call back if she had any questions or concerns.

## 2020-05-03 NOTE — Telephone Encounter (Signed)
Charlotte please advise.  Pt rescheduled her appointment from today 05/03/20 to 05/16/2020 and she is wanting to know if she could come in for labs prior so that they an be gone over before appointment.

## 2020-05-03 NOTE — Telephone Encounter (Signed)
I will prefer to order labs after appt, I make sure I am ordering the right ones

## 2020-05-03 NOTE — Telephone Encounter (Signed)
Patient rescheduled and asked if she can come in prior to her appointment to do labs to discuss at appointment. Pls advise. CB is [phone].

## 2020-05-16 ENCOUNTER — Ambulatory Visit: Payer: Self-pay | Admitting: Nurse Practitioner

## 2020-07-31 ENCOUNTER — Telehealth (INDEPENDENT_AMBULATORY_CARE_PROVIDER_SITE_OTHER): Payer: 59 | Admitting: Family Medicine

## 2020-07-31 ENCOUNTER — Encounter: Payer: Self-pay | Admitting: Family Medicine

## 2020-07-31 VITALS — Ht 67.0 in

## 2020-07-31 DIAGNOSIS — R1013 Epigastric pain: Secondary | ICD-10-CM

## 2020-07-31 DIAGNOSIS — K580 Irritable bowel syndrome with diarrhea: Secondary | ICD-10-CM

## 2020-07-31 DIAGNOSIS — R197 Diarrhea, unspecified: Secondary | ICD-10-CM | POA: Insufficient documentation

## 2020-07-31 DIAGNOSIS — K9041 Non-celiac gluten sensitivity: Secondary | ICD-10-CM

## 2020-07-31 DIAGNOSIS — E041 Nontoxic single thyroid nodule: Secondary | ICD-10-CM | POA: Diagnosis not present

## 2020-07-31 DIAGNOSIS — I2510 Atherosclerotic heart disease of native coronary artery without angina pectoris: Secondary | ICD-10-CM

## 2020-07-31 HISTORY — DX: Diarrhea, unspecified: R19.7

## 2020-07-31 HISTORY — DX: Epigastric pain: R10.13

## 2020-07-31 NOTE — Progress Notes (Signed)
Established Patient Office Visit  Subjective:  Patient ID: Rachel Arnold, female    DOB: [birth year]  Age: 59 y.o. MRN: [id number]  CC:  Chief Complaint  Patient presents with  . Diarrhea    C/O of diarrhea x 8 days and nausea x 18 months     HPI Rachel Arnold presents for evaluation of an 8-day history of watery greater than loose diarrhea.  There has been no blood or pus fever or chills.  She has not lost any weight.  She has an ongoing history of intermittent nausea with epigastric pain and discomfort.  She has been burping some and passing gas but she denies any reflux or indigestion.  Longtime smoker of 2 packs of cigarettes daily.  No alcohol for 2 years.  She is under considerable stress.  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  One of her daughters recently had a baby who was preterm by 7 weeks.  Baby's father died of Covid recently.  Increased burping and flatulence seem to be improved when she avoids bread.  Past Medical History:  Diagnosis Date  . Seasonal mood disorder (HCC)   . Thyroid goiter     Past Surgical History:  Procedure Laterality Date  . stress myoview  12/22/2008   EF 69%; LV norm.    Family History  Problem Relation Age of Onset  . Congestive Heart Failure Mother   . Coronary artery disease Mother   . Diabetes Mother   . Hypertension Mother   . Cancer Father   . Cancer Brother        Metastatic  . Coronary artery disease Maternal Grandmother   . Diabetes Maternal Grandmother   . Hypertension Maternal Grandmother   . Early death Brother   . Drug abuse Brother     Social History   Socioeconomic History  . Marital status: Married    Spouse name: Not on file  . Number of children: Not on file  . Years of education: Not on file  . Highest education level: Not on file  Occupational History  . Not on file  Tobacco Use  . Smoking status: Current Every Day Smoker    Packs/day: 2.00    Years: 20.00    Pack years: 40.00    Types: Cigarettes    . Smokeless tobacco: Never Used  . Tobacco comment: chantix prescribed  04/12/2012  Vaping Use  . Vaping Use: Never used  Substance and Sexual Activity  . Alcohol use: Yes    Comment: Occas  . Drug use: Never  . Sexual activity: Not Currently  Other Topics Concern  . Not on file  Social History Narrative  . Not on file   Social Determinants of Health   Financial Resource Strain:   . Difficulty of Paying Living Expenses:   Food Insecurity:   . Worried About Programme researcher, broadcasting/film/video in the Last Year:   . Barista in the Last Year:   Transportation Needs:   . Freight forwarder (Medical):   Marland Kitchen Lack of Transportation (Non-Medical):   Physical Activity:   . Days of Exercise per Week:   . Minutes of Exercise per Session:   Stress:   . Feeling of Stress :   Social Connections:   . Frequency of Communication with Friends and Family:   . Frequency of Social Gatherings with Friends and Family:   . Attends Religious Services:   . Active Member of Clubs or Organizations:   .  Attends Banker Meetings:   Marland Kitchen Marital Status:   Intimate Partner Violence:   . Fear of Current or Ex-Partner:   . Emotionally Abused:   Marland Kitchen Physically Abused:   . Sexually Abused:     Outpatient Medications Prior to Visit  Medication Sig Dispense Refill  . phentermine (ADIPEX-P) 37.5 MG tablet Take 1 tablet by mouth daily. (Patient not taking: Reported on 07/31/2020)  2   No facility-administered medications prior to visit.    Allergies  Allergen Reactions  . Penicillins     ROS Review of Systems    Objective:    Physical Exam  Ht 5\' 7"  (1.702 m)   BMI 26.00 kg/m  Wt Readings from Last 3 Encounters:  02/10/19 166 lb (75.3 kg)  01/03/19 169 lb 6.4 oz (76.8 kg)  12/06/18 174 lb (78.9 kg)     Health Maintenance Due  Topic Date Due  . Hepatitis C Screening  Never done  . HIV Screening  Never done  . INFLUENZA VACCINE  07/15/2020    There are no preventive care  reminders to display for this patient.  Lab Results  Component Value Date   TSH 0.87 01/03/2019   Lab Results  Component Value Date   WBC 12.2 09/25/2017   HGB 14.3 09/25/2017   HCT 42 09/25/2017   PLT 241 09/25/2017   Lab Results  Component Value Date   NA 141 10/13/2018   K 3.5 10/13/2018   CO2 28 10/13/2018   GLUCOSE 110 (H) 10/13/2018   BUN 11 10/13/2018   CREATININE 0.99 10/13/2018   BILITOT 0.4 10/13/2018   ALKPHOS 59 10/13/2018   AST 16 10/13/2018   ALT 30 10/13/2018   PROT 6.4 10/13/2018   ALBUMIN 4.1 10/13/2018   CALCIUM 9.2 10/13/2018   GFR 61.51 10/13/2018   Lab Results  Component Value Date   CHOL 181 10/13/2018   Lab Results  Component Value Date   HDL 40.60 10/13/2018   Lab Results  Component Value Date   LDLCALC 117 09/25/2017   Lab Results  Component Value Date   TRIG 274.0 (H) 10/13/2018   Lab Results  Component Value Date   CHOLHDL 4 10/13/2018   Lab Results  Component Value Date   HGBA1C 5.7 10/13/2018      Assessment & Plan:   Problem List Items Addressed This Visit      Cardiovascular and Mediastinum   Coronary artery calcification seen on CT scan     Digestive   Irritable bowel syndrome with diarrhea - Primary   Relevant Orders   CBC   Comprehensive metabolic panel     Endocrine   Right thyroid nodule   Relevant Orders   10/15/2018 THYROID     Other   Epigastric pain   Relevant Orders   CBC   Comprehensive metabolic panel   Amylase   Lipase   Hepatitis C antibody   HIV Antibody (routine testing w rflx)   US Abdomen Complete   Diarrhea   Relevant Orders   Stool Culture   Stool, WBC/Lactoferrin   Sodium, stool   Osmolality, stool   NCGS (non-celiac gluten sensitivity)   Relevant Orders   Celiac Disease Antibody Screen      No orders of the defined types were placed in this encounter.   Follow-up: No follow-ups on file.   Will return in September for a physical exam and will discuss calcified coronary  arteries and smoking cessation at that time. October  Doreene Burke, MD   Virtual Visit via Telephone Note  I connected with Rachel Arnold on 07/31/20 at 11:30 AM EDT by telephone and verified that I am speaking with the correct person using two identifiers.  Location: Patient: work alone.  Provider:    I discussed the limitations, risks, security and privacy concerns of performing an evaluation and management service by telephone and the availability of in person appointments. I also discussed with the patient that there may be a patient responsible charge related to this service. The patient expressed understanding and agreed to proceed.   History of Present Illness:    Observations/Objective:   Assessment and Plan:   Follow Up Instructions:    I discussed the assessment and treatment plan with the patient. The patient was provided an opportunity to ask questions and all were answered. The patient agreed with the plan and demonstrated an understanding of the instructions.   The patient was advised to call back or seek an in-person evaluation if the symptoms worsen or if the condition fails to improve as anticipated.  I provided 30 minutes of non-face-to-face time during this encounter.   Mliss Sax, MD

## 2020-08-02 ENCOUNTER — Other Ambulatory Visit (INDEPENDENT_AMBULATORY_CARE_PROVIDER_SITE_OTHER): Payer: 59

## 2020-08-02 ENCOUNTER — Other Ambulatory Visit: Payer: Self-pay

## 2020-08-02 DIAGNOSIS — R197 Diarrhea, unspecified: Secondary | ICD-10-CM

## 2020-08-02 DIAGNOSIS — K9041 Non-celiac gluten sensitivity: Secondary | ICD-10-CM | POA: Diagnosis not present

## 2020-08-02 DIAGNOSIS — K580 Irritable bowel syndrome with diarrhea: Secondary | ICD-10-CM

## 2020-08-02 DIAGNOSIS — R1013 Epigastric pain: Secondary | ICD-10-CM

## 2020-08-02 NOTE — Addendum Note (Signed)
Addended by: Varney Biles on: 08/02/2020 03:41 PM   Modules accepted: Orders

## 2020-08-03 ENCOUNTER — Other Ambulatory Visit: Payer: 59

## 2020-08-03 LAB — CBC
HCT: 42.1 % (ref 36.0–46.0)
Hemoglobin: 14.1 g/dL (ref 12.0–15.0)
MCHC: 33.4 g/dL (ref 30.0–36.0)
MCV: 89.2 fl (ref 78.0–100.0)
Platelets: 263 10*3/uL (ref 150.0–400.0)
RBC: 4.72 Mil/uL (ref 3.87–5.11)
RDW: 13.3 % (ref 11.5–15.5)
WBC: 14.5 10*3/uL — ABNORMAL HIGH (ref 4.0–10.5)

## 2020-08-03 LAB — COMPREHENSIVE METABOLIC PANEL
ALT: 14 U/L (ref 0–35)
AST: 16 U/L (ref 0–37)
Albumin: 4.4 g/dL (ref 3.5–5.2)
Alkaline Phosphatase: 94 U/L (ref 39–117)
BUN: 8 mg/dL (ref 6–23)
CO2: 28 mEq/L (ref 19–32)
Calcium: 9.9 mg/dL (ref 8.4–10.5)
Chloride: 105 mEq/L (ref 96–112)
Creatinine, Ser: 0.92 mg/dL (ref 0.40–1.20)
GFR: 62.59 mL/min (ref >60.00)
Glucose, Bld: 101 mg/dL — ABNORMAL HIGH (ref 70–99)
Potassium: 4.6 mEq/L (ref 3.5–5.1)
Sodium: 141 mEq/L (ref 135–145)
Total Bilirubin: 0.3 mg/dL (ref 0.2–1.2)
Total Protein: 6.6 g/dL (ref 6.0–8.3)

## 2020-08-03 LAB — LIPASE: Lipase: 38 U/L (ref 11.0–59.0)

## 2020-08-03 LAB — HEPATITIS C ANTIBODY
Hepatitis C Ab: NONREACTIVE
SIGNAL TO CUT-OFF: 0.01 (ref <1.00)

## 2020-08-03 LAB — AMYLASE: Amylase: 28 U/L (ref 27–131)

## 2020-08-03 LAB — HIV ANTIBODY (ROUTINE TESTING W REFLEX): HIV 1&2 Ab, 4th Generation: NONREACTIVE

## 2020-08-03 LAB — CELIAC DISEASE ANTIBODY SCREEN
Antigliadin Abs, IgA: 6 units (ref 0–19)
IgA/Immunoglobulin A, Serum: 153 mg/dL (ref 87–352)
Transglutaminase IgA: <2 U/mL (ref 0–3)

## 2020-08-07 ENCOUNTER — Ambulatory Visit
Admission: RE | Admit: 2020-08-07 | Discharge: 2020-08-07 | Disposition: A | Discharge status: Home / Self Care | Payer: 59 | Source: Ambulatory Visit | Attending: Family Medicine | Admitting: Family Medicine

## 2020-08-07 DIAGNOSIS — R1013 Epigastric pain: Secondary | ICD-10-CM

## 2020-08-07 DIAGNOSIS — E041 Nontoxic single thyroid nodule: Secondary | ICD-10-CM

## 2020-08-08 NOTE — Progress Notes (Signed)
Please schedule for virtual or live in order that we may discuss recent labs and ultrasounds.

## 2020-08-10 LAB — GASTROINTESTINAL PATHOGEN PANEL PCR
C. difficile Tox A/B, PCR: NOT DETECTED
Campylobacter, PCR: NOT DETECTED
Cryptosporidium, PCR: NOT DETECTED
E coli (ETEC) LT/ST PCR: NOT DETECTED
E coli (STEC) stx1/stx2, PCR: NOT DETECTED
E coli 0157, PCR: NOT DETECTED
Giardia lamblia, PCR: NOT DETECTED
Norovirus, PCR: NOT DETECTED
Rotavirus A, PCR: NOT DETECTED
Salmonella, PCR: NOT DETECTED
Shigella, PCR: NOT DETECTED

## 2020-08-10 LAB — SODIUM, STOOL: Sodium, Feces: 121.3 mEq/L

## 2020-08-10 LAB — FECAL LACTOFERRIN, QUANT
Fecal Lactoferrin: NEGATIVE
MICRO NUMBER:: 10853064
SPECIMEN QUALITY:: ADEQUATE

## 2020-08-10 LAB — OSMOLALITY, STOOL: Osmolality, Feces: 317 mOsm/kg

## 2020-09-04 ENCOUNTER — Encounter: Payer: Self-pay | Admitting: Family Medicine

## 2020-09-05 ENCOUNTER — Encounter: Payer: Self-pay | Admitting: Family Medicine

## 2020-09-05 ENCOUNTER — Other Ambulatory Visit: Payer: Self-pay

## 2020-09-05 ENCOUNTER — Ambulatory Visit (INDEPENDENT_AMBULATORY_CARE_PROVIDER_SITE_OTHER): Payer: 59 | Admitting: Family Medicine

## 2020-09-05 VITALS — BP 118/76 | HR 77 | Temp 98.2°F | Ht 67.0 in | Wt 181.2 lb

## 2020-09-05 DIAGNOSIS — Z0000 Encounter for general adult medical examination without abnormal findings: Secondary | ICD-10-CM | POA: Insufficient documentation

## 2020-09-05 DIAGNOSIS — R5383 Other fatigue: Secondary | ICD-10-CM

## 2020-09-05 DIAGNOSIS — I2510 Atherosclerotic heart disease of native coronary artery without angina pectoris: Secondary | ICD-10-CM

## 2020-09-05 DIAGNOSIS — K760 Fatty (change of) liver, not elsewhere classified: Secondary | ICD-10-CM

## 2020-09-05 DIAGNOSIS — F17200 Nicotine dependence, unspecified, uncomplicated: Secondary | ICD-10-CM | POA: Diagnosis not present

## 2020-09-05 DIAGNOSIS — K580 Irritable bowel syndrome with diarrhea: Secondary | ICD-10-CM | POA: Diagnosis not present

## 2020-09-05 DIAGNOSIS — Z23 Encounter for immunization: Secondary | ICD-10-CM | POA: Diagnosis not present

## 2020-09-05 DIAGNOSIS — F418 Other specified anxiety disorders: Secondary | ICD-10-CM

## 2020-09-05 DIAGNOSIS — L710 Perioral dermatitis: Secondary | ICD-10-CM

## 2020-09-05 DIAGNOSIS — E041 Nontoxic single thyroid nodule: Secondary | ICD-10-CM | POA: Diagnosis not present

## 2020-09-05 HISTORY — DX: Other fatigue: R53.83

## 2020-09-05 MED ORDER — DOXYCYCLINE HYCLATE 100 MG PO TABS: 100.0000 mg | ORAL_TABLET | Freq: Two times a day (BID) | ORAL | 0 refills | Status: AC

## 2020-09-05 MED ORDER — ESCITALOPRAM OXALATE 10 MG PO TABS: ORAL_TABLET | ORAL | 1 refills | Status: DC

## 2020-09-05 MED ORDER — ATORVASTATIN CALCIUM 10 MG PO TABS: 10.0000 mg | ORAL_TABLET | Freq: Every day | ORAL | 3 refills | Status: DC

## 2020-09-05 MED ORDER — CYANOCOBALAMIN 1000 MCG/ML IJ SOLN
1000.0000 ug | Freq: Once | INTRAMUSCULAR | Status: AC
  Administered 2020-09-05: 1000 ug via INTRAMUSCULAR

## 2020-09-05 NOTE — Progress Notes (Signed)
Established Patient Office Visit  Subjective:  Patient ID: Rachel Arnold, female    DOB: 30-Jan-1961  Age: 59 y.o. MRN: [id number]  CC:  Chief Complaint  Patient presents with  . Transitions Of Care    toc from Dr. Deborra Medina, concerns about stomach issues, rash on face some concerns about mental state.     HPI WYLEE Arnold presents for follow-up of multiple issues and concerns. Previous scans had shown coronary artery calcifications. Lipid profile had shown minimal elevation of LDL with a low HDL. Patient does have a 22-year history of smoking. However she recently quit. She is having no chest pain. She did experience shortness of breath prior to quitting smoking but that is improving significantly. She is up-to-date on her female health maintenance. She sees the dentist regularly. 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 She does not use illicit drugs and does not drink alcohol. She has a rash around her mouth. It is irritating. It has been there for several months. Prior scans had shown thyroid nodules. Recent thyroid ultrasound showed 1 nodule that met criteria for biopsy. Follow-up serial ultrasounds were recommended.  Past Medical History:  Diagnosis Date  . Seasonal mood disorder ([place])   . Thyroid goiter     Past Surgical History:  Procedure Laterality Date  . stress myoview  12/22/2008   EF 69%; LV norm.    Family History  Problem Relation Age of Onset  . Congestive Heart Failure Mother   . Coronary artery disease Mother   . Diabetes Mother   . Hypertension Mother   . Cancer Father   . Cancer Brother        Metastatic  . Coronary artery disease Maternal Grandmother   . Diabetes Maternal Grandmother   . Hypertension Maternal Grandmother   . Early death Brother   . Drug abuse Brother     Social History   Socioeconomic History  . Marital status: Married    Spouse name:  Not on file  . Number of children: Not on file  . Years of education: Not on file  . Highest education level: Not on file  Occupational History  . Not on file  Tobacco Use  . Smoking status: Former Smoker    Packs/day: 2.00    Years: 20.00    Pack years: 40.00    Types: Cigarettes    Quit date: 08/08/2020    Years since quitting: 0.0  . Smokeless tobacco: Never Used  . Tobacco comment: chantix prescribed  04/12/2012  Vaping Use  . Vaping Use: Never used  Substance and Sexual Activity  . Alcohol use: Yes    Comment: Occas  . Drug use: Never  . Sexual activity: Not Currently  Other Topics Concern  . Not on file  Social History Narrative  . Not on file   Social Determinants of Health   Financial Resource Strain:   . Difficulty of Paying Living Expenses: Not on file  Food Insecurity:   . Worried About Charity fundraiser in the Last Year: Not on file  . Ran Out of Food in the Last Year: Not on file  Transportation Needs:   . Lack of Transportation (Medical): Not on file  . Lack of Transportation (Non-Medical): Not on file  Physical Activity:   . Days of Exercise per Week: Not on file  . Minutes of Exercise per Session: Not on  file  Stress:   . Feeling of Stress : Not on file  Social Connections:   . Frequency of Communication with Friends and Family: Not on file  . Frequency of Social Gatherings with Friends and Family: Not on file  . Attends Religious Services: Not on file  . Active Member of Clubs or Organizations: Not on file  . Attends Archivist Meetings: Not on file  . Marital Status: Not on file  Intimate Partner Violence:   . Fear of Current or Ex-Partner: Not on file  . Emotionally Abused: Not on file  . Physically Abused: Not on file  . Sexually Abused: Not on file    Outpatient Medications Prior to Visit  Medication Sig Dispense Refill  . phentermine (ADIPEX-P) 37.5 MG tablet Take 1 tablet by mouth daily. (Patient not taking: Reported on  07/31/2020)  2   No facility-administered medications prior to visit.    Allergies  Allergen Reactions  . Penicillins     ROS Review of Systems  Constitutional: Positive for fatigue. Negative for chills, diaphoresis, fever and unexpected weight change.  HENT: Negative.   Eyes: Negative for photophobia and visual disturbance.  Respiratory: Negative.  Negative for chest tightness.   Cardiovascular: Negative for chest pain and palpitations.  Gastrointestinal: Negative for anal bleeding and blood in stool.  Endocrine: Negative for polyphagia and polyuria.  Genitourinary: Negative.   Musculoskeletal: Negative for gait problem and joint swelling.  Skin: Positive for rash.  Allergic/Immunologic: Negative for immunocompromised state.  Neurological: Negative for tremors and speech difficulty.  Hematological: Does not bruise/bleed easily.    Depression screen [facility] 2/9 09/05/2020 01/03/2019 10/13/2018  Decreased Interest 1 0 1  Down, Depressed, Hopeless 1 0 0  PHQ - 2 Score 2 0 1  Altered sleeping - - 0  Tired, decreased energy - - 1  Change in appetite - - 0  Feeling bad or failure about yourself  - - 0  Trouble concentrating - - 0  Moving slowly or fidgety/restless - - 0  Suicidal thoughts - - 0  PHQ-9 Score - - 2  Difficult doing work/chores - - Not difficult at all       Objective:    Physical Exam Nursing note reviewed.  Constitutional:      General: She is not in acute distress.    Appearance: Normal appearance. She is not ill-appearing, toxic-appearing or diaphoretic.  HENT:     Head: Normocephalic and atraumatic.     Right Ear: Tympanic membrane and ear canal normal.     Left Ear: Tympanic membrane, ear canal and external ear normal.     Mouth/Throat:     Mouth: Mucous membranes are moist.     Pharynx: Oropharynx is clear. No oropharyngeal exudate or posterior oropharyngeal erythema.  Eyes:     General: No scleral icterus.       Right eye: No discharge.         Left eye: No discharge.     Extraocular Movements: Extraocular movements intact.     Conjunctiva/sclera: Conjunctivae normal.     Pupils: Pupils are equal, round, and reactive to light.  Cardiovascular:     Rate and Rhythm: Normal rate and regular rhythm.  Pulmonary:     Effort: Pulmonary effort is normal.     Breath sounds: Normal breath sounds.  Abdominal:     General: Bowel sounds are normal.  Musculoskeletal:     Cervical back: No rigidity or tenderness.  Right lower leg: No edema.     Left lower leg: No edema.  Lymphadenopathy:     Cervical: No cervical adenopathy.  Skin:    General: Skin is warm and dry.       Neurological:     Mental Status: She is alert and oriented to person, place, and time.  Psychiatric:        Mood and Affect: Mood normal.        Behavior: Behavior normal.     BP 118/76   Pulse 77   Temp 98.2 F (36.8 C) (Tympanic)   Ht 5' 7" (1.702 m)   Wt 181 lb 3.2 oz (82.2 kg)   SpO2 95%   BMI 28.38 kg/m  Wt Readings from Last 3 Encounters:  09/05/20 181 lb 3.2 oz (82.2 kg)  02/10/19 166 lb (75.3 kg)  01/03/19 169 lb 6.4 oz (76.8 kg)     There are no preventive care reminders to display for this patient.  There are no preventive care reminders to display for this patient.  Lab Results  Component Value Date   TSH 0.87 01/03/2019   Lab Results  Component Value Date   WBC 14.5 (H) 08/02/2020   HGB 14.1 08/02/2020   HCT 42.1 08/02/2020   MCV 89.2 08/02/2020   PLT 263.0 08/02/2020   Lab Results  Component Value Date   NA 141 08/02/2020   K 4.6 08/02/2020   CO2 28 08/02/2020   GLUCOSE 101 (H) 08/02/2020   BUN 8 08/02/2020   CREATININE 0.92 08/02/2020   BILITOT 0.3 08/02/2020   ALKPHOS 94 08/02/2020   AST 16 08/02/2020   ALT 14 08/02/2020   PROT 6.6 08/02/2020   ALBUMIN 4.4 08/02/2020   CALCIUM 9.9 08/02/2020   GFR 62.59 08/02/2020   Lab Results  Component Value Date   CHOL 181 10/13/2018   Lab Results  Component Value  Date   HDL 40.60 10/13/2018   Lab Results  Component Value Date   LDLCALC 117 09/25/2017   Lab Results  Component Value Date   TRIG 274.0 (H) 10/13/2018   Lab Results  Component Value Date   CHOLHDL 4 10/13/2018   Lab Results  Component Value Date   HGBA1C 5.7 10/13/2018      Assessment & Plan:   Problem List Items Addressed This Visit      Cardiovascular and Mediastinum   Coronary artery calcification seen on CT scan   Relevant Medications   atorvastatin (LIPITOR) 10 MG tablet   Other Relevant Orders   CBC   Comprehensive metabolic panel   Lipid panel   Urinalysis, Routine w reflex microscopic     Digestive   Irritable bowel syndrome with diarrhea   Perioral dermatitis   Relevant Medications   doxycycline (VIBRA-TABS) 100 MG tablet   Hepatic steatosis     Endocrine   Right thyroid nodule   Relevant Orders   US Thyroid Biopsy   TSH   US THYROID     Other   Smoker   Anxiety with depression   Relevant Medications   escitalopram (LEXAPRO) 10 MG tablet   Other Relevant Orders   Ambulatory referral to Psychology   Need for influenza vaccination - Primary   Relevant Orders   Flu Vaccine QUAD 6+ mos PF IM (Fluarix Quad PF) (Completed)   Fatigue   Relevant Orders   CBC   Healthcare maintenance      Meds ordered this encounter  Medications  . escitalopram (LEXAPRO)  10 MG tablet    Sig: Take one nightly for one week and then increase to two at night.    Dispense:  60 tablet    Refill:  1  . atorvastatin (LIPITOR) 10 MG tablet    Sig: Take 1 tablet (10 mg total) by mouth daily.    Dispense:  90 tablet    Refill:  3  . doxycycline (VIBRA-TABS) 100 MG tablet    Sig: Take 1 tablet (100 mg total) by mouth 2 (two) times daily for 21 days.    Dispense:  42 tablet    Refill:  0  . cyanocobalamin ((VITAMIN B-12)) injection 1,000 mcg    Follow-up: Return in about 4 weeks (around 10/03/2020).  Given information on Lexapro and agrees to start the  medication. Agrees to go for talking therapy. Congratulated her for stopping smoking. Agrees to start atorvastatin regarding coronary artery calcification. Doxycycline for perioral dermatitis. Advised to avoid sun exposure while taking this medication. Scheduled for thyroid biopsy.  Libby Maw, MD

## 2020-09-05 NOTE — Patient Instructions (Signed)
Escitalopram tablets What is this medicine? ESCITALOPRAM (es sye TAL oh pram) is used to treat depression and certain types of anxiety. This medicine may be used for other purposes; ask your health care provider or pharmacist if you have questions. COMMON BRAND NAME(S): Lexapro What should I tell my health care provider before I take this medicine? They need to know if you have any of these conditions:  bipolar disorder or a family history of bipolar disorder  diabetes  glaucoma  heart disease  kidney or liver disease  receiving electroconvulsive therapy  seizures (convulsions)  suicidal thoughts, plans, or attempt by you or a family member  an unusual or allergic reaction to escitalopram, the related drug citalopram, other medicines, foods, dyes, or preservatives  pregnant or trying to become pregnant  breast-feeding How should I use this medicine? Take this medicine by mouth with a glass of water. Follow the directions on the prescription label. You can take it with or without food. If it upsets your stomach, take it with food. Take your medicine at regular intervals. Do not take it more often than directed. Do not stop taking this medicine suddenly except upon the advice of your doctor. Stopping this medicine too quickly may cause serious side effects or your condition may worsen. A special MedGuide will be given to you by the pharmacist with each prescription and refill. Be sure to read this information carefully each time. Talk to your pediatrician regarding the use of this medicine in children. Special care may be needed. Overdosage: If you think you have taken too much of this medicine contact a poison control center or emergency room at once. NOTE: This medicine is only for you. Do not share this medicine with others. What if I miss a dose? If you miss a dose, take it as soon as you can. If it is almost time for your next dose, take only that dose. Do not take double or  extra doses. What may interact with this medicine? Do not take this medicine with any of the following medications:  certain medicines for fungal infections like fluconazole, itraconazole, ketoconazole, posaconazole, voriconazole  cisapride  citalopram  dronedarone  linezolid  MAOIs like Carbex, Eldepryl, Marplan, Nardil, and Parnate  methylene blue (injected into a vein)  pimozide  thioridazine This medicine may also interact with the following medications:  alcohol  amphetamines  aspirin and aspirin-like medicines  carbamazepine  certain medicines for depression, anxiety, or psychotic disturbances  certain medicines for migraine headache like almotriptan, eletriptan, frovatriptan, naratriptan, rizatriptan, sumatriptan, zolmitriptan  certain medicines for sleep  certain medicines that treat or prevent blood clots like warfarin, enoxaparin, dalteparin  cimetidine  diuretics  dofetilide  fentanyl  furazolidone  isoniazid  lithium  metoprolol  NSAIDs, medicines for pain and inflammation, like ibuprofen or naproxen  other medicines that prolong the QT interval (cause an abnormal heart rhythm)  procarbazine  rasagiline  supplements like St. John's wort, kava kava, valerian  tramadol  tryptophan  ziprasidone This list may not describe all possible interactions. Give your health care provider a list of all the medicines, herbs, non-prescription drugs, or dietary supplements you use. Also tell them if you smoke, drink alcohol, or use illegal drugs. Some items may interact with your medicine. What should I watch for while using this medicine? Tell your doctor if your symptoms do not get better or if they get worse. Visit your doctor or health care professional for regular checks on your progress. Because it may   take several weeks to see the full effects of this medicine, it is important to continue your treatment as prescribed by your doctor. Patients  and their families should watch out for new or worsening thoughts of suicide or depression. Also watch out for sudden changes in feelings such as feeling anxious, agitated, panicky, irritable, hostile, aggressive, impulsive, severely restless, overly excited and hyperactive, or not being able to sleep. If this happens, especially at the beginning of treatment or after a change in dose, call your health care professional. Bonita Quin may get drowsy or dizzy. Do not drive, use machinery, or do anything that needs mental alertness until you know how this medicine affects you. Do not stand or sit up quickly, especially if you are an older patient. This reduces the risk of dizzy or fainting spells. Alcohol may interfere with the effect of this medicine. Avoid alcoholic drinks. Your mouth may get dry. Chewing sugarless gum or sucking hard candy, and drinking plenty of water may help. Contact your doctor if the problem does not go away or is severe. What side effects may I notice from receiving this medicine? Side effects that you should report to your doctor or health care professional as soon as possible:  allergic reactions like skin rash, itching or hives, swelling of the face, lips, or tongue  anxious  black, tarry stools  changes in vision  confusion  elevated mood, decreased need for sleep, racing thoughts, impulsive behavior  eye pain  fast, irregular heartbeat  feeling faint or lightheaded, falls  feeling agitated, angry, or irritable  hallucination, loss of contact with reality  loss of balance or coordination  loss of memory  painful or prolonged erections  restlessness, pacing, inability to keep still  seizures  stiff muscles  suicidal thoughts or other mood changes  trouble sleeping  unusual bleeding or bruising  unusually weak or tired  vomiting Side effects that usually do not require medical attention (report to your doctor or health care professional if they  continue or are bothersome):  changes in appetite  change in sex drive or performance  headache  increased sweating  indigestion, nausea  tremors This list may not describe all possible side effects. Call your doctor for medical advice about side effects. You may report side effects to FDA at 1-800-FDA-1088. Where should I keep my medicine? Keep out of reach of children. Store at room temperature between 15 and 30 degrees C (59 and 86 degrees F). Throw away any unused medicine after the expiration date. NOTE: This sheet is a summary. It may not cover all possible information. If you have questions about this medicine, talk to your doctor, pharmacist, or health care provider.  2020 Elsevier/Gold Standard (2018-11-22 11:21:44) Amlodipine; Atorvastatin oral tablets What is this medicine? AMLODIPINE; ATORVASTATIN (am LOE di peen; a TORE va sta tin) is a combination of 2 drugs. Amlodipine is a calcium-channel blocker used to lower high blood pressure. It also relieves chest pain caused by angina. Atorvastatin blocks the body's ability to make cholesterol. It can help lower blood cholesterol for patients who are at risk of getting heart disease or a stroke. It is only for patients whose cholesterol level is not controlled by diet. This medicine may be used for other purposes; ask your health care provider or pharmacist if you have questions. COMMON BRAND NAME(S): Caduet What should I tell my health care provider before I take this medicine? They need to know if you have any of these conditions:  an  alcohol problem  heart problems, including heart failure or aortic stenosis  hormone disorder like diabetes or under-active thyroid  infection  kidney or liver disease  low blood pressure  other medical condition  recent surgery  seizures (convulsions)  severe injury  an unusual or allergic reaction to Amlodipine; Atorvastatin, medicines, foods, dyes, or preservatives  pregnant  or trying to get pregnant  breast-feeding How should I use this medicine? Take this medicine by mouth with a glass of water. Follow the directions on the prescription label. You can take the tablets with or without food. Do not change the amount of grapefruit juice you drink from day to day while taking this drug, or avoid grapefruit juice altogether. Take your doses at regular intervals. Do not take your medicine more often then directed. Do not suddenly stop taking this medicine. Ask your doctor or health care professional how you can gradually reduce the dose. Talk to your pediatrician regarding the use of this medicine in children. Special care may be needed. Overdosage: If you think you have taken too much of this medicine contact a poison control center or emergency room at once. NOTE: This medicine is only for you. Do not share this medicine with others. What if I miss a dose? If you miss a dose, take it as soon as you can. If it is almost time for your next dose, take only that dose. Do not take double or extra doses. What may interact with this medicine? Do not take this medicine with any of the following medications:  other cholesterol medicines known as statins like fluvastatin, lovastatin, pravastatin, and simvastatin  red yeast rice  telaprevir  telithromycin  voriconazole This medicine may also interact with the following medications:  antiviral medicines for HIV or AIDS  boceprevir  certain medicines for cholesterol like clofibrate, fenofibrate, and gemfibrozil  cyclosporine  digoxin  female hormones, like estrogens or progestins and birth control pills  grapefruit juice  medicines for fungal infections like fluconazole, itraconazole, ketoconazole, voriconazole  medicines for high blood pressure  niacin  rifampin  some antibiotics like clarithromycin, erythromycin, and troleandomycin This list may not describe all possible interactions. Give your health  care provider a list of all the medicines, herbs, non-prescription drugs, or dietary supplements you use. Also tell them if you smoke, drink alcohol, or use illegal drugs. Some items may interact with your medicine. What should I watch for while using this medicine? Visit your doctor or health care professional for regular checks on your progress. You will need to have regular tests to make sure your liver is working properly. Tell your doctor or health care professional as soon as you can if you get any unexplained muscle pain, tenderness, or weakness, especially if you also have a fever and tiredness. Your doctor or health care professional may tell you to stop taking this medicine if you develop muscle problems. If your muscle problems do not go away after stopping this medicine, contact your health care professional. This medicine contains a cholesterol-lowering agent but is only part of a total cholesterol-lowering program. Your physician or dietitian can suggest a low-cholesterol and low-fat diet that will reduce your risk of getting heart and blood vessel disease. Avoid alcohol and smoking, and keep a proper exercise schedule. Check your blood pressure and pulse rate regularly. Ask your doctor or health care professional what your blood pressure and pulse rate should be and when you should contact him or her. This medicine may affect blood sugar  levels. If you have diabetes, check with your doctor or health care professional before you change your diet or the dose of your diabetic medicine. You may feel dizzy or lightheaded. Do not drive, use machinery, or do anything that needs mental alertness until you know how this medicine affects you. To reduce the risk of dizzy or fainting spells, do not sit or stand up quickly, especially if you are an older patient. Avoid alcoholic drinks. They can make you more dizzy, increase flushing and rapid heartbeats. This medicine may cause a decrease in Co-Enzyme  Q-10. You should make sure that you get enough Co-Enzyme Q-10 while you are taking this medicine. Discuss the foods you eat and the vitamins you take with your health care professional. What side effects may I notice from receiving this medicine? Side effects that you should report to your doctor or health care professional as soon as possible:  allergic reactions like skin rash, itching or hives, swelling of the face, lips, or tongue  chest pain  dark urine  fast, irregular heartbeat  feeling faint or lightheaded, falls  fever  muscle pain, weakness  redness, blistering, peeling or loosening of the skin, including inside the mouth  swelling of ankles, legs  trouble passing urine or change in the amount of urine  unusually weak or tired  yellowing of the eyes or skin Side effects that usually do not require medical attention (report to your doctor or health care professional if they continue or are bothersome):  diarrhea  facial flushing  gas  headache  nausea, vomiting  stomach upset or pain This list may not describe all possible side effects. Call your doctor for medical advice about side effects. You may report side effects to FDA at 1-800-FDA-1088. Where should I keep my medicine? Keep out of the reach of children. Store at room temperature between 15 and 30 degrees C (59 and 86 degrees F). Throw away any unused medicine after the expiration date. NOTE: This sheet is a summary. It may not cover all possible information. If you have questions about this medicine, talk to your doctor, pharmacist, or health care provider.  2020 Elsevier/Gold Standard (2017-07-15 09:58:24) Atorvastatin tablets What is this medicine? ATORVASTATIN (a TORE va sta tin) is known as a HMG-CoA reductase inhibitor or 'statin'. It lowers the level of cholesterol and triglycerides in the blood. This drug may also reduce the risk of heart attack, stroke, or other health problems in patients  with risk factors for heart disease. Diet and lifestyle changes are often used with this drug. This medicine may be used for other purposes; ask your health care provider or pharmacist if you have questions. COMMON BRAND NAME(S): Lipitor What should I tell my health care provider before I take this medicine? They need to know if you have any of these conditions:  diabetes  if you often drink alcohol  history of stroke  kidney disease  liver disease  muscle aches or weakness  thyroid disease  an unusual or allergic reaction to atorvastatin, other medicines, foods, dyes, or preservatives  pregnant or trying to get pregnant  breast-feeding How should I use this medicine? Take this medicine by mouth with a glass of water. Follow the directions on the prescription label. You can take it with or without food. If it upsets your stomach, take it with food. Do not take with grapefruit juice. Take your medicine at regular intervals. Do not take it more often than directed. Do not stop taking  except on your doctor's advice. Talk to your pediatrician regarding the use of this medicine in children. While this drug may be prescribed for children as young as 10 for selected conditions, precautions do apply. Overdosage: If you think you have taken too much of this medicine contact a poison control center or emergency room at once. NOTE: This medicine is only for you. Do not share this medicine with others. What if I miss a dose? If you miss a dose, take it as soon as you can. If your next dose is to be taken in less than 12 hours, then do not take the missed dose. Take the next dose at your regular time. Do not take double or extra doses. What may interact with this medicine? Do not take this medicine with any of the following medications:  dasabuvir; ombitasvir; paritaprevir; ritonavir  ombitasvir; paritaprevir; ritonavir  posaconazole  red yeast rice This medicine may also interact with  the following medications:  alcohol  birth control pills  certain antibiotics like erythromycin and clarithromycin  certain antivirals for HIV or hepatitis  certain medicines for cholesterol like fenofibrate, gemfibrozil, and niacin  certain medicines for fungal infections like ketoconazole and itraconazole  colchicine  cyclosporine  digoxin  grapefruit juice  rifampin This list may not describe all possible interactions. Give your health care provider a list of all the medicines, herbs, non-prescription drugs, or dietary supplements you use. Also tell them if you smoke, drink alcohol, or use illegal drugs. Some items may interact with your medicine. What should I watch for while using this medicine? Visit your doctor or health care professional for regular check-ups. You may need regular tests to make sure your liver is working properly. Your health care professional may tell you to stop taking this medicine if you develop muscle problems. If your muscle problems do not go away after stopping this medicine, contact your health care professional. Do not become pregnant while taking this medicine. Women should inform their health care professional if they wish to become pregnant or think they might be pregnant. There is a potential for serious side effects to an unborn child. Talk to your health care professional or pharmacist for more information. Do not breast-feed an infant while taking this medicine. This medicine may increase blood sugar. Ask your healthcare provider if changes in diet or medicines are needed if you have diabetes. If you are going to need surgery or other procedure, tell your doctor that you are using this medicine. This drug is only part of a total heart-health program. Your doctor or a dietician can suggest a low-cholesterol and low-fat diet to help. Avoid alcohol and smoking, and keep a proper exercise schedule. This medicine may cause a decrease in Co-Enzyme  Q-10. You should make sure that you get enough Co-Enzyme Q-10 while you are taking this medicine. Discuss the foods you eat and the vitamins you take with your health care professional. What side effects may I notice from receiving this medicine? Side effects that you should report to your doctor or health care professional as soon as possible:  allergic reactions like skin rash, itching or hives, swelling of the face, lips, or tongue  fever  joint pain  loss of memory  redness, blistering, peeling or loosening of the skin, including inside the mouth  signs and symptoms of high blood sugar such as being more thirsty or hungry or having to urinate more than normal. You may also feel very tired or have blurry  vision.  signs and symptoms of liver injury like dark yellow or brown urine; general ill feeling or flu-like symptoms; light-belly pain; unusually weak or tired; yellowing of the eyes or skin  signs and symptoms of muscle injury like dark urine; trouble passing urine or change in the amount of urine; unusually weak or tired; muscle pain or side or back pain Side effects that usually do not require medical attention (report to your doctor or health care professional if they continue or are bothersome):  diarrhea  nausea  stomach pain  trouble sleeping  upset stomach This list may not describe all possible side effects. Call your doctor for medical advice about side effects. You may report side effects to FDA at 1-800-FDA-1088. Where should I keep my medicine? Keep out of the reach of children. Store between 20 and 25 degrees C (68 and 77 degrees F). Throw away any unused medicine after the expiration date. NOTE: This sheet is a summary. It may not cover all possible information. If you have questions about this medicine, talk to your doctor, pharmacist, or health care provider.  2020 Elsevier/Gold Standard (2018-09-22 11:36:16)

## 2020-09-10 ENCOUNTER — Other Ambulatory Visit: Payer: Self-pay

## 2020-09-11 ENCOUNTER — Other Ambulatory Visit (INDEPENDENT_AMBULATORY_CARE_PROVIDER_SITE_OTHER): Payer: 59

## 2020-09-11 DIAGNOSIS — E041 Nontoxic single thyroid nodule: Secondary | ICD-10-CM | POA: Diagnosis not present

## 2020-09-11 DIAGNOSIS — R5383 Other fatigue: Secondary | ICD-10-CM | POA: Diagnosis not present

## 2020-09-11 DIAGNOSIS — I2510 Atherosclerotic heart disease of native coronary artery without angina pectoris: Secondary | ICD-10-CM | POA: Diagnosis not present

## 2020-09-11 LAB — LIPID PANEL
Cholesterol: 191 mg/dL (ref 0–200)
HDL: 44.3 mg/dL (ref >39.00)
LDL Cholesterol: 118 mg/dL — ABNORMAL HIGH (ref 0–99)
NonHDL: 146.28
Total CHOL/HDL Ratio: 4
Triglycerides: 140 mg/dL (ref 0.0–149.0)
VLDL: 28 mg/dL (ref 0.0–40.0)

## 2020-09-11 LAB — CBC
HCT: 39.1 % (ref 36.0–46.0)
Hemoglobin: 13.1 g/dL (ref 12.0–15.0)
MCHC: 33.4 g/dL (ref 30.0–36.0)
MCV: 88.9 fl (ref 78.0–100.0)
Platelets: 249 10*3/uL (ref 150.0–400.0)
RBC: 4.41 Mil/uL (ref 3.87–5.11)
RDW: 13.1 % (ref 11.5–15.5)
WBC: 9.5 10*3/uL (ref 4.0–10.5)

## 2020-09-11 LAB — URINALYSIS, ROUTINE W REFLEX MICROSCOPIC
Bilirubin Urine: NEGATIVE
Ketones, ur: NEGATIVE
Leukocytes,Ua: NEGATIVE
Nitrite: NEGATIVE
Specific Gravity, Urine: 1.02 (ref 1.000–1.030)
Total Protein, Urine: NEGATIVE
Urine Glucose: NEGATIVE
Urobilinogen, UA: 0.2 (ref 0.0–1.0)
pH: 6 (ref 5.0–8.0)

## 2020-09-11 LAB — COMPREHENSIVE METABOLIC PANEL
ALT: 17 U/L (ref 0–35)
AST: 10 U/L (ref 0–37)
Albumin: 4.3 g/dL (ref 3.5–5.2)
Alkaline Phosphatase: 56 U/L (ref 39–117)
BUN: 11 mg/dL (ref 6–23)
CO2: 30 mEq/L (ref 19–32)
Calcium: 9.3 mg/dL (ref 8.4–10.5)
Chloride: 106 mEq/L (ref 96–112)
Creatinine, Ser: 0.92 mg/dL (ref 0.40–1.20)
GFR: 62.56 mL/min (ref >60.00)
Glucose, Bld: 90 mg/dL (ref 70–99)
Potassium: 4.2 mEq/L (ref 3.5–5.1)
Sodium: 142 mEq/L (ref 135–145)
Total Bilirubin: 0.5 mg/dL (ref 0.2–1.2)
Total Protein: 6.2 g/dL (ref 6.0–8.3)

## 2020-09-11 LAB — TSH: TSH: 1.05 u[IU]/mL (ref 0.35–4.50)

## 2020-09-18 ENCOUNTER — Ambulatory Visit
Admission: RE | Admit: 2020-09-18 | Discharge: 2020-09-18 | Disposition: A | Discharge status: Home / Self Care | Payer: 59 | Source: Ambulatory Visit | Attending: Family Medicine | Admitting: Family Medicine

## 2020-09-18 ENCOUNTER — Other Ambulatory Visit (HOSPITAL_COMMUNITY)
Admission: RE | Admit: 2020-09-18 | Discharge: 2020-09-18 | Disposition: A | Discharge status: Home / Self Care | Payer: 59 | Source: Ambulatory Visit | Attending: Family Medicine | Admitting: Family Medicine

## 2020-09-18 DIAGNOSIS — E041 Nontoxic single thyroid nodule: Secondary | ICD-10-CM | POA: Insufficient documentation

## 2020-09-19 LAB — CYTOLOGY - NON PAP

## 2020-10-04 ENCOUNTER — Encounter: Payer: Self-pay | Admitting: Family Medicine

## 2020-10-04 ENCOUNTER — Other Ambulatory Visit: Payer: Self-pay

## 2020-10-04 ENCOUNTER — Ambulatory Visit (INDEPENDENT_AMBULATORY_CARE_PROVIDER_SITE_OTHER): Payer: 59 | Admitting: Family Medicine

## 2020-10-04 VITALS — BP 116/68 | HR 71 | Temp 98.1°F | Ht 67.0 in | Wt 178.8 lb

## 2020-10-04 DIAGNOSIS — F418 Other specified anxiety disorders: Secondary | ICD-10-CM

## 2020-10-04 DIAGNOSIS — E7800 Pure hypercholesterolemia, unspecified: Secondary | ICD-10-CM | POA: Insufficient documentation

## 2020-10-04 DIAGNOSIS — I2510 Atherosclerotic heart disease of native coronary artery without angina pectoris: Secondary | ICD-10-CM | POA: Diagnosis not present

## 2020-10-04 NOTE — Progress Notes (Signed)
Established Patient Office Visit  Subjective:  Patient ID: Rachel Arnold, female    DOB: 12-20-1960  Age: 59 y.o. MRN: [id number]  CC:  Chief Complaint  Patient presents with  . Follow-up    1 month follow up, patient states that she have not started on Rx that you prescribed for her. Would like to know the alternatives to the prescriptions.     HPI Rachel Arnold presents for follow-up of coronary artery calcifications and anxiety with depression.  Patient has quit smoking.  She has adjusted her diet to heart healthy 1.  She is exercising.  She is seeking counseling.  She would like to avoid starting a statin at this point.  Past Medical History:  Diagnosis Date  . Seasonal mood disorder (HCC)   . Thyroid goiter     Past Surgical History:  Procedure Laterality Date  . stress myoview  12/22/2008   EF 69%; LV norm.    Family History  Problem Relation Age of Onset  . Congestive Heart Failure Mother   . Coronary artery disease Mother   . Diabetes Mother   . Hypertension Mother   . Cancer Father   . Cancer Brother        Metastatic  . Coronary artery disease Maternal Grandmother   . Diabetes Maternal Grandmother   . Hypertension Maternal Grandmother   . Early death Brother   . Drug abuse Brother     Social History   Socioeconomic History  . Marital status: Married    Spouse name: Not on file  . Number of children: Not on file  . Years of education: Not on file  . Highest education level: Not on file  Occupational History  . Not on file  Tobacco Use  . Smoking status: Former Smoker    Packs/day: 2.00    Years: 20.00    Pack years: 40.00    Types: Cigarettes    Quit date: 08/08/2020    Years since quitting: 0.1  . Smokeless tobacco: Never Used  . Tobacco comment: chantix prescribed  04/12/2012  Vaping Use  . Vaping Use: Never used  Substance and Sexual Activity  . Alcohol use: Not Currently    Comment: Occas  . Drug use: Never  . Sexual activity: Not  Currently  Other Topics Concern  . Not on file  Social History Narrative  . Not on file   Social Determinants of Health   Financial Resource Strain:   . Difficulty of Paying Living Expenses: Not on file  Food Insecurity:   . Worried About Programme researcher, broadcasting/film/video in the Last Year: Not on file  . Ran Out of Food in the Last Year: Not on file  Transportation Needs:   . Lack of Transportation (Medical): Not on file  . Lack of Transportation (Non-Medical): Not on file  Physical Activity:   . Days of Exercise per Week: Not on file  . Minutes of Exercise per Session: Not on file  Stress:   . Feeling of Stress : Not on file  Social Connections:   . Frequency of Communication with Friends and Family: Not on file  . Frequency of Social Gatherings with Friends and Family: Not on file  . Attends Religious Services: Not on file  . Active Member of Clubs or Organizations: Not on file  . Attends Banker Meetings: Not on file  . Marital Status: Not on file  Intimate Partner Violence:   . Fear of Current  or Ex-Partner: Not on file  . Emotionally Abused: Not on file  . Physically Abused: Not on file  . Sexually Abused: Not on file    Outpatient Medications Prior to Visit  Medication Sig Dispense Refill  . atorvastatin (LIPITOR) 10 MG tablet Take 1 tablet (10 mg total) by mouth daily. (Patient not taking: Reported on 10/04/2020) 90 tablet 3  . escitalopram (LEXAPRO) 10 MG tablet Take one nightly for one week and then increase to two at night. (Patient not taking: Reported on 10/04/2020) 60 tablet 1   No facility-administered medications prior to visit.    Allergies  Allergen Reactions  . Penicillins     ROS Review of Systems  Constitutional: Negative.   Respiratory: Negative.   Cardiovascular: Negative.   Gastrointestinal: Negative.       Objective:    Physical Exam Vitals and nursing note reviewed.  Constitutional:      Appearance: Normal appearance.  HENT:      Head: Normocephalic and atraumatic.     Right Ear: External ear normal.     Left Ear: External ear normal.  Eyes:     General: No scleral icterus.       Right eye: No discharge.        Left eye: No discharge.     Conjunctiva/sclera: Conjunctivae normal.  Pulmonary:     Effort: Pulmonary effort is normal.  Skin:    General: Skin is warm and dry.  Neurological:     Mental Status: She is alert.  Psychiatric:        Mood and Affect: Mood normal.        Behavior: Behavior normal.     BP 116/68   Pulse 71   Temp 98.1 F (36.7 C) (Tympanic)   Ht 5\' 7"  (1.702 m)   Wt 178 lb 12.8 oz (81.1 kg)   SpO2 95%   BMI 28.00 kg/m  Wt Readings from Last 3 Encounters:  10/04/20 178 lb 12.8 oz (81.1 kg)  09/05/20 181 lb 3.2 oz (82.2 kg)  02/10/19 166 lb (75.3 kg)     Health Maintenance Due  Topic Date Due  . MAMMOGRAM  09/13/2020    There are no preventive care reminders to display for this patient.  Lab Results  Component Value Date   TSH 1.05 09/11/2020   Lab Results  Component Value Date   WBC 9.5 09/11/2020   HGB 13.1 09/11/2020   HCT 39.1 09/11/2020   MCV 88.9 09/11/2020   PLT 249.0 09/11/2020   Lab Results  Component Value Date   NA 142 09/11/2020   K 4.2 09/11/2020   CO2 30 09/11/2020   GLUCOSE 90 09/11/2020   BUN 11 09/11/2020   CREATININE 0.92 09/11/2020   BILITOT 0.5 09/11/2020   ALKPHOS 56 09/11/2020   AST 10 09/11/2020   ALT 17 09/11/2020   PROT 6.2 09/11/2020   ALBUMIN 4.3 09/11/2020   CALCIUM 9.3 09/11/2020   GFR 62.56 09/11/2020   Lab Results  Component Value Date   CHOL 191 09/11/2020   Lab Results  Component Value Date   HDL 44.30 09/11/2020   Lab Results  Component Value Date   LDLCALC 118 (H) 09/11/2020   Lab Results  Component Value Date   TRIG 140.0 09/11/2020   Lab Results  Component Value Date   CHOLHDL 4 09/11/2020   Lab Results  Component Value Date   HGBA1C 5.7 10/13/2018      Assessment & Plan:   Problem  List  Items Addressed This Visit      Cardiovascular and Mediastinum   Coronary artery calcification seen on CT scan - Primary     Other   Anxiety with depression   Elevated LDL cholesterol level     The 10-year ASCVD risk score Denman George DC Jr., et al., 2013) is: 5.6%   Values used to calculate the score:     Age: 60 years     Sex: Female     Is Non-Hispanic African American: No     Diabetic: No     Tobacco smoker: Yes     Systolic Blood Pressure: 116 mmHg     Is BP treated: No     HDL Cholesterol: 44.3 mg/dL     Total Cholesterol: 191 mg/dL No orders of the defined types were placed in this encounter.   Follow-up: Return in about 6 months (around 04/04/2021).  Patient is making a concerted effort to adopt healthier lifestyles.  I am applauding her.  Like to hold off on statin therapy and therapy with Lexapro.  She will pursue counseling.  She was given information on heart healthy diet and the Mediterranean diet as possible ideas.  She will continue tobacco abstinence.  Follow-up in the springtime for recheck of her lipid profile.  Follow-up sooner if needed.  Mliss Sax, MD

## 2020-10-04 NOTE — Patient Instructions (Signed)
Preventing High Cholesterol Cholesterol is a white, waxy substance similar to fat that the human body needs to help build cells. The liver makes all the cholesterol that a person's body needs. Having high cholesterol (hypercholesterolemia) increases a person's risk for heart disease and stroke. Extra (excess) cholesterol comes from the food the person eats. High cholesterol can often be prevented with diet and lifestyle changes. If you already have high cholesterol, you can control it with diet and lifestyle changes and with medicine. How can high cholesterol affect me? If you have high cholesterol, deposits (plaques) may build up on the walls of your arteries. The arteries are the blood vessels that carry blood away from your heart. Plaques make the arteries narrower and stiffer. This can limit or block blood flow and cause blood clots to form. Blood clots:  Are tiny balls of cells that form in your blood.  Can move to the heart or brain, causing a heart attack or stroke. Plaques in arteries greatly increase your risk for heart attack and stroke.Making diet and lifestyle changes can reduce your risk for these conditions that may threaten your life. What can increase my risk? This condition is more likely to develop in people who:  Eat foods that are high in saturated fat or cholesterol. Saturated fat is mostly found in: ? Foods that contain animal fat, such as red meat and some dairy products. ? Certain fatty foods made from plants, such as tropical oils.  Are overweight.  Are not getting enough exercise.  Have a family history of high cholesterol. What actions can I take to prevent this? Nutrition   Eat less saturated fat.  Avoid trans fats (partially hydrogenated oils). These are often found in margarine and in some baked goods, fried foods, and snacks bought in packages.  Avoid precooked or cured meat, such as sausages or meat loaves.  Avoid foods and drinks that have added  sugars.  Eat more fruits, vegetables, and whole grains.  Choose healthy sources of protein, such as fish, poultry, lean cuts of red meat, beans, peas, lentils, and nuts.  Choose healthy sources of fat, such as: ? Nuts. ? Vegetable oils, especially olive oil. ? Fish that have healthy fats (omega-3 fatty acids), such as mackerel or salmon. The items listed above may not be a complete list of recommended foods and beverages. Contact a dietitian for more information. Lifestyle  Lose weight if you are overweight. Losing 5-10 lb (2.3-4.5 kg) can help prevent or control high cholesterol. It can also lower your risk for diabetes and high blood pressure. Ask your health care provider to help you with a diet and exercise plan to lose weight safely.  Do not use any products that contain nicotine or tobacco, such as cigarettes, e-cigarettes, and chewing tobacco. If you need help quitting, ask your health care provider.  Limit your alcohol intake. ? Do not drink alcohol if:  Your health care provider tells you not to drink.  You are pregnant, may be pregnant, or are planning to become pregnant. ? If you drink alcohol:  Limit how much you use to:  0-1 drink a day for women.  0-2 drinks a day for men.  Be aware of how much alcohol is in your drink. In the [place] one drink equals one 12 oz bottle of beer (355 mL), one 5 oz glass of wine (148 mL), or one 1 oz glass of hard liquor (44 mL). Activity   Get enough exercise. Each week, do at   least 150 minutes of exercise that takes a medium level of effort (moderate-intensity exercise). ? This is exercise that:  Makes your heart beat faster and makes you breathe harder than usual.  Allows you to still be able to talk. ? You could exercise in short sessions several times a day or longer sessions a few times a week. For example, on 5 days each week, you could walk fast or ride your bike 3 times a day for 10 minutes each time.  Do exercises as told  by your health care provider. Medicines  In addition to diet and lifestyle changes, your health care provider may recommend medicines to help lower cholesterol. This may be a medicine to lower the amount of cholesterol your liver makes. You may need medicine if: ? Diet and lifestyle changes do not lower your cholesterol enough. ? You have high cholesterol and other risk factors for heart disease or stroke.  Take over-the-counter and prescription medicines only as told by your health care provider. General information  Manage your risk factors for high cholesterol. Talk with your health care provider about all your risk factors and how to lower your risk.  Manage other conditions that you have, such as diabetes or high blood pressure (hypertension).  Have blood tests to check your cholesterol levels at regular points in time as told by your health care provider.  Keep all follow-up visits as told by your health care provider. This is important. Where to find more information  American Heart Association: www.heart.org  National Heart, Lung, and Blood Institute: Pop[employer].is Summary  High cholesterol increases your risk for heart disease and stroke. By keeping your cholesterol level low, you can reduce your risk for these conditions.  High cholesterol can often be prevented with diet and lifestyle changes.  Work with your health care provider to manage your risk factors, and have your blood tested regularly. This information is not intended to replace advice given to you by your health care provider. Make sure you discuss any questions you have with your health care provider. Document Revised: 03/25/2019 Document Reviewed: 08/09/2016 Elsevier Patient Education  2020 [employer].  Mediterranean Diet A Mediterranean diet refers to food and lifestyle choices that are based on the traditions of countries located on the [employer] [place]. This way of eating has been shown to help  prevent certain conditions and improve outcomes for people who have chronic diseases, like kidney disease and heart disease. What are tips for following this plan? Lifestyle  Cook and eat meals together with your family, when possible.  Drink enough fluid to keep your urine clear or pale yellow.  Be physically active every day. This includes: ? Aerobic exercise like running or swimming. ? Leisure activities like gardening, walking, or housework.  Get 7-8 hours of sleep each night.  If recommended by your health care provider, drink red wine in moderation. This means 1 glass a day for nonpregnant women and 2 glasses a day for men. A glass of wine equals 5 oz (150 mL). Reading food labels   Check the serving size of packaged foods. For foods such as rice and pasta, the serving size refers to the amount of cooked product, not dry.  Check the total fat in packaged foods. Avoid foods that have saturated fat or trans fats.  Check the ingredients list for added sugars, such as corn syrup. Shopping  At the grocery store, buy most of your food from the areas near the walls of the store.  This includes: ? Fresh fruits and vegetables (produce). ? Grains, beans, nuts, and seeds. Some of these may be available in unpackaged forms or large amounts (in bulk). ? Fresh seafood. ? Poultry and eggs. ? Low-fat dairy products.  Buy whole ingredients instead of prepackaged foods.  Buy fresh fruits and vegetables in-season from local farmers markets.  Buy frozen fruits and vegetables in resealable bags.  If you do not have access to quality fresh seafood, buy precooked frozen shrimp or canned fish, such as tuna, salmon, or sardines.  Buy small amounts of raw or cooked vegetables, salads, or olives from the deli or salad bar at your store.  Stock your pantry so you always have certain foods on hand, such as olive oil, canned tuna, canned tomatoes, rice, pasta, and beans. Cooking  Cook foods with  extra-virgin olive oil instead of using butter or other vegetable oils.  Have meat as a side dish, and have vegetables or grains as your main dish. This means having meat in small portions or adding small amounts of meat to foods like pasta or stew.  Use beans or vegetables instead of meat in common dishes like chili or lasagna.  Experiment with different cooking methods. Try roasting or broiling vegetables instead of steaming or sauteing them.  Add frozen vegetables to soups, stews, pasta, or rice.  Add nuts or seeds for added healthy fat at each meal. You can add these to yogurt, salads, or vegetable dishes.  Marinate fish or vegetables using olive oil, lemon juice, garlic, and fresh herbs. Meal planning   Plan to eat 1 vegetarian meal one day each week. Try to work up to 2 vegetarian meals, if possible.  Eat seafood 2 or more times a week.  Have healthy snacks readily available, such as: ? Vegetable sticks with hummus. ? [place] yogurt. ? Fruit and nut trail mix.  Eat balanced meals throughout the week. This includes: ? Fruit: 2-3 servings a day ? Vegetables: 4-5 servings a day ? Low-fat dairy: 2 servings a day ? Fish, poultry, or lean meat: 1 serving a day ? Beans and legumes: 2 or more servings a week ? Nuts and seeds: 1-2 servings a day ? Whole grains: 6-8 servings a day ? Extra-virgin olive oil: 3-4 servings a day  Limit red meat and sweets to only a few servings a month What are my food choices?  Mediterranean diet ? Recommended  Grains: Whole-grain pasta. Brown rice. Bulgar wheat. Polenta. Couscous. Whole-wheat bread. Orpah Cobb.  Vegetables: Artichokes. Beets. Broccoli. Cabbage. Carrots. Eggplant. Green beans. Chard. Kale. Spinach. Onions. Leeks. Peas. Squash. Tomatoes. Peppers. Radishes.  Fruits: Apples. Apricots. Avocado. Berries. Bananas. Cherries. Dates. Figs. Grapes. Lemons. Melon. Oranges. Peaches. Plums. Pomegranate.  Meats and other protein foods:  Beans. Almonds. Sunflower seeds. Pine nuts. Peanuts. Cod. Salmon. Scallops. Shrimp. Tuna. Tilapia. Clams. Oysters. Eggs.  Dairy: Low-fat milk. Cheese. Greek yogurt.  Beverages: Water. Red wine. Herbal tea.  Fats and oils: Extra virgin olive oil. Avocado oil. Grape seed oil.  Sweets and desserts: [place] yogurt with honey. Baked apples. Poached pears. Trail mix.  Seasoning and other foods: Basil. Cilantro. Coriander. Cumin. Mint. Parsley. Sage. Rosemary. Tarragon. Garlic. Oregano. Thyme. Pepper. Balsalmic vinegar. Tahini. Hummus. Tomato sauce. Olives. Mushrooms. ? Limit these  Grains: Prepackaged pasta or rice dishes. Prepackaged cereal with added sugar.  Vegetables: Deep fried potatoes (french fries).  Fruits: Fruit canned in syrup.  Meats and other protein foods: Beef. Pork. Lamb. Poultry with skin. Hot dogs. Tomasa Blase.  Dairy: Ice cream. Sour cream. Whole milk.  Beverages: Juice. Sugar-sweetened soft drinks. Beer. Liquor and spirits.  Fats and oils: Butter. Canola oil. Vegetable oil. Beef fat (tallow). Lard.  Sweets and desserts: Cookies. Cakes. Pies. Candy.  Seasoning and other foods: Mayonnaise. Premade sauces and marinades. The items listed may not be a complete list. Talk with your dietitian about what dietary choices are right for you. Summary  The Mediterranean diet includes both food and lifestyle choices.  Eat a variety of fresh fruits and vegetables, beans, nuts, seeds, and whole grains.  Limit the amount of red meat and sweets that you eat.  Talk with your health care provider about whether it is safe for you to drink red wine in moderation. This means 1 glass a day for nonpregnant women and 2 glasses a day for men. A glass of wine equals 5 oz (150 mL). This information is not intended to replace advice given to you by your health care provider. Make sure you discuss any questions you have with your health care provider. Document Revised: 07/31/2016 Document Reviewed:  07/24/2016 Elsevier Patient Education  2020 [employer].

## 2020-12-12 ENCOUNTER — Telehealth: Payer: Self-pay

## 2020-12-12 NOTE — Telephone Encounter (Signed)
Pt calling to let Dr. Doreene Burke know that she is now taking the Escitalopram 10mg .

## 2020-12-12 NOTE — Telephone Encounter (Signed)
FYI message for dr. Doreene Burke. Patient calling to inform that she have just recently started on Lexapro per patient at last visit she was not interested in starting on medication that Dr. Doreene Burke had recommended but she now have decided to start on this and it seems to be fine so far.

## 2020-12-15 HISTORY — PX: RHINOPLASTY: SUR1284

## 2020-12-15 HISTORY — PX: FACIAL COSMETIC SURGERY: SHX629

## 2020-12-25 ENCOUNTER — Other Ambulatory Visit: Payer: Self-pay | Admitting: Family Medicine

## 2020-12-25 DIAGNOSIS — F418 Other specified anxiety disorders: Secondary | ICD-10-CM

## 2020-12-26 ENCOUNTER — Other Ambulatory Visit: Payer: Self-pay

## 2020-12-26 NOTE — Progress Notes (Signed)
Rachel Arnold is a 60 y.o. female  Chief Complaint  Patient presents with  . Pre-op Exam    Pre-op for elective surgery.  Needs EKG, CBC & CMP done.      HPI: Rachel Arnold is a 60 y.o. female patient of Dr. Ethelene Hal who is seen today for preoperative evaluation, labs, and EKG.  Procedure: face and neck lift, rhinoplasty, face CO2 laser Date of procedure: 01/08/21 Surgeon: Dr. Tennis Ship  Recent URI symptoms - no Personal or family h/o clotting disorder - no Previous issues with anesthesia - no  Pt quit smoking in 07/2020.  Past Medical History:  Diagnosis Date  . Seasonal mood disorder ([place])   . Thyroid goiter     Past Surgical History:  Procedure Laterality Date  . stress myoview  12/22/2008   EF 69%; LV norm.    Social History   Socioeconomic History  . Marital status: Married    Spouse name: Not on file  . Number of children: Not on file  . Years of education: Not on file  . Highest education level: Not on file  Occupational History  . Not on file  Tobacco Use  . Smoking status: Former Smoker    Packs/day: 0.00    Years: 20.00    Pack years: 0.00    Quit date: 08/08/2020    Years since quitting: 0.3  . Smokeless tobacco: Never Used  . Tobacco comment: chantix prescribed  04/12/2012  Vaping Use  . Vaping Use: Never used  Substance and Sexual Activity  . Alcohol use: Not Currently    Comment: Occas  . Drug use: Never  . Sexual activity: Not Currently  Other Topics Concern  . Not on file  Social History Narrative  . Not on file   Social Determinants of Health   Financial Resource Strain: Not on file  Food Insecurity: Not on file  Transportation Needs: Not on file  Physical Activity: Not on file  Stress: Not on file  Social Connections: Not on file  Intimate Partner Violence: Not on file    Family History  Problem Relation Age of Onset  . Congestive Heart Failure Mother   . Coronary artery disease Mother   . Diabetes Mother   .  Hypertension Mother   . Cancer Father   . Cancer Brother        Metastatic  . Coronary artery disease Maternal Grandmother   . Diabetes Maternal Grandmother   . Hypertension Maternal Grandmother   . Early death Brother   . Drug abuse Brother      Immunization History  Administered Date(s) Administered  . Influenza,inj,Quad PF,6+ Mos 09/05/2020  . PFIZER SARS-COV-2 Vaccination 02/27/2020, 04/01/2020, 09/23/2020  . Pneumococcal Polysaccharide-23 06/13/2015  . Tdap 06/13/2015    Outpatient Encounter Medications as of 12/27/2020  Medication Sig  . escitalopram (LEXAPRO) 10 MG tablet TAKE 1 TABLET BY MOUTH EVERY NIGHT FOR 1 WEEK. THEN INCREASE TO 2 AT NIGHT  . HYDROcodone-acetaminophen (NORCO/VICODIN) 5-325 MG tablet Take 1 tablet by mouth every 6 (six) hours as needed.  . Multiple Vitamins-Minerals (HAIR SKIN AND NAILS FORMULA) TABS Take by mouth.  . ondansetron (ZOFRAN-ODT) 4 MG disintegrating tablet SMARTSIG:1 Tablet(s) By Mouth 4-5 Times Daily  . valACYclovir (VALTREX) 500 MG tablet Take 500 mg by mouth daily.  Marland Kitchen atorvastatin (LIPITOR) 10 MG tablet Take 1 tablet (10 mg total) by mouth daily. (Patient not taking: No sig reported)   No facility-administered encounter medications on file as  of 12/27/2020.     ROS: Gen: no fever, chills  Skin: no rash, itching ENT: no ear pain, ear drainage, nasal congestion, rhinorrhea, sinus pressure, sore throat Resp: no cough, wheeze,SOB CV: no CP, palpitations, LE edema,  GI: no heartburn, n/v/d/c, abd pain GU: no dysuria, urgency, frequency, hematuria MSK: no joint pain, myalgias, back pain Neuro: no dizziness, headache, weakness    Allergies  Allergen Reactions  . Penicillins     BP 112/78   Pulse 64   Temp (!) 97.4 F (36.3 C) (Temporal)   Ht '5\' 7"'  (1.702 m)   Wt 178 lb (80.7 kg)   SpO2 96%   BMI 27.88 kg/m    BP Readings from Last 3 Encounters:  12/27/20 112/78  10/04/20 116/68  09/05/20 118/76   Pulse Readings from  Last 3 Encounters:  12/27/20 64  10/04/20 71  09/05/20 77   Wt Readings from Last 3 Encounters:  12/27/20 178 lb (80.7 kg)  10/04/20 178 lb 12.8 oz (81.1 kg)  09/05/20 181 lb 3.2 oz (82.2 kg)     Physical Exam Constitutional:      General: She is not in acute distress.    Appearance: She is well-developed and well-nourished.  HENT:     Head: Normocephalic and atraumatic.     Right Ear: Tympanic membrane and ear canal normal.     Left Ear: Tympanic membrane and ear canal normal.     Nose: Nose normal.     Mouth/Throat:     Mouth: Oropharynx is clear and moist and mucous membranes are normal. Mucous membranes are moist.     Pharynx: Oropharynx is clear.  Eyes:     Conjunctiva/sclera: Conjunctivae normal.  Neck:     Thyroid: No thyromegaly.  Cardiovascular:     Rate and Rhythm: Normal rate and regular rhythm.     Pulses: Intact distal pulses.     Heart sounds: Normal heart sounds. No murmur heard.   Pulmonary:     Effort: Pulmonary effort is normal. No respiratory distress.     Breath sounds: Normal breath sounds. No wheezing or rhonchi.  Abdominal:     General: Bowel sounds are normal. There is no distension.     Palpations: Abdomen is soft. There is no mass.     Tenderness: There is no abdominal tenderness.  Musculoskeletal:        General: No edema.     Cervical back: Neck supple.     Right lower leg: No edema.     Left lower leg: No edema.  Lymphadenopathy:     Cervical: No cervical adenopathy.  Skin:    General: Skin is warm and dry.  Neurological:     Mental Status: She is alert and oriented to person, place, and time.     Motor: No abnormal muscle tone.     Coordination: Coordination normal.  Psychiatric:        Mood and Affect: Mood and affect and mood normal.        Behavior: Behavior normal.    Lab Results  Component Value Date   CHOL 191 09/11/2020   HDL 44.30 09/11/2020   LDLCALC 118 (H) 09/11/2020   LDLDIRECT 117.0 10/13/2018   TRIG 140.0  09/11/2020   CHOLHDL 4 09/11/2020     A/P:  1. Preoperative examination - pt is acceptable risk for planned surgical procedure - CBC - Comp Met (CMET) - EKG 12-Lead - will fax my office note and all lab results and  EKG to surgeon's office once available  2. Elevated LDL cholesterol level - stable - mildly elevated, pt declined lipitor   3. Anxiety with depression - stable, controlled on lexapro 13m daily   This visit occurred during the SARS-CoV-2 public health emergency.  Safety protocols were in place, including screening questions prior to the visit, additional usage of staff PPE, and extensive cleaning of exam room while observing appropriate contact time as indicated for disinfecting solutions.

## 2020-12-27 ENCOUNTER — Ambulatory Visit (INDEPENDENT_AMBULATORY_CARE_PROVIDER_SITE_OTHER): Payer: Self-pay | Admitting: Family Medicine

## 2020-12-27 ENCOUNTER — Encounter: Payer: Self-pay | Admitting: Family Medicine

## 2020-12-27 ENCOUNTER — Telehealth: Payer: Self-pay | Admitting: Family Medicine

## 2020-12-27 VITALS — BP 112/78 | HR 64 | Temp 97.4°F | Ht 67.0 in | Wt 178.0 lb

## 2020-12-27 DIAGNOSIS — E7800 Pure hypercholesterolemia, unspecified: Secondary | ICD-10-CM

## 2020-12-27 DIAGNOSIS — F418 Other specified anxiety disorders: Secondary | ICD-10-CM

## 2020-12-27 DIAGNOSIS — Z01818 Encounter for other preprocedural examination: Secondary | ICD-10-CM

## 2020-12-27 LAB — CBC
HCT: 38.9 % (ref 36.0–46.0)
Hemoglobin: 13.1 g/dL (ref 12.0–15.0)
MCHC: 33.8 g/dL (ref 30.0–36.0)
MCV: 84.9 fl (ref 78.0–100.0)
Platelets: 254 10*3/uL (ref 150.0–400.0)
RBC: 4.58 Mil/uL (ref 3.87–5.11)
RDW: 13.5 % (ref 11.5–15.5)
WBC: 7.7 10*3/uL (ref 4.0–10.5)

## 2020-12-27 LAB — COMPREHENSIVE METABOLIC PANEL
ALT: 16 U/L (ref 0–35)
AST: 16 U/L (ref 0–37)
Albumin: 4.4 g/dL (ref 3.5–5.2)
Alkaline Phosphatase: 65 U/L (ref 39–117)
BUN: 12 mg/dL (ref 6–23)
CO2: 28 mEq/L (ref 19–32)
Calcium: 9.3 mg/dL (ref 8.4–10.5)
Chloride: 107 mEq/L (ref 96–112)
Creatinine, Ser: 0.95 mg/dL (ref 0.40–1.20)
GFR: 65.85 mL/min (ref >60.00)
Glucose, Bld: 92 mg/dL (ref 70–99)
Potassium: 3.9 mEq/L (ref 3.5–5.1)
Sodium: 142 mEq/L (ref 135–145)
Total Bilirubin: 0.4 mg/dL (ref 0.2–1.2)
Total Protein: 6.1 g/dL (ref 6.0–8.3)

## 2020-12-27 MED ORDER — ESCITALOPRAM OXALATE 20 MG PO TABS: 20.0000 mg | ORAL_TABLET | Freq: Every day | ORAL | 3 refills | Status: DC

## 2020-12-27 NOTE — Telephone Encounter (Signed)
Ok with me 

## 2020-12-27 NOTE — Telephone Encounter (Signed)
Patient would like to do a TOC to Dr. Barron Alvine. She feels that she is a better fit. Please advise is transfer is okay.

## 2020-12-28 ENCOUNTER — Other Ambulatory Visit: Payer: Self-pay

## 2020-12-28 ENCOUNTER — Ambulatory Visit (INDEPENDENT_AMBULATORY_CARE_PROVIDER_SITE_OTHER): Payer: Self-pay

## 2020-12-28 ENCOUNTER — Telehealth: Payer: Self-pay

## 2020-12-28 DIAGNOSIS — Z01818 Encounter for other preprocedural examination: Secondary | ICD-10-CM

## 2020-12-28 NOTE — Progress Notes (Signed)
EKG completed.  Dm/cma

## 2020-12-28 NOTE — Telephone Encounter (Signed)
EKG, OV notes and labs faxed to [facility] @ [phone].  Dm/cma

## 2021-02-07 ENCOUNTER — Other Ambulatory Visit: Payer: Self-pay

## 2021-02-07 ENCOUNTER — Telehealth: Payer: Self-pay

## 2021-02-07 ENCOUNTER — Observation Stay: Payer: 59

## 2021-02-07 ENCOUNTER — Encounter: Payer: Self-pay | Admitting: Emergency Medicine

## 2021-02-07 ENCOUNTER — Observation Stay
Admission: EM | Admit: 2021-02-07 | Discharge: 2021-02-08 | Disposition: A | Discharge status: Home / Self Care | Payer: 59 | Attending: Internal Medicine | Admitting: Internal Medicine

## 2021-02-07 DIAGNOSIS — K922 Gastrointestinal hemorrhage, unspecified: Secondary | ICD-10-CM | POA: Diagnosis not present

## 2021-02-07 DIAGNOSIS — R55 Syncope and collapse: Secondary | ICD-10-CM | POA: Diagnosis not present

## 2021-02-07 DIAGNOSIS — K920 Hematemesis: Secondary | ICD-10-CM | POA: Diagnosis present

## 2021-02-07 DIAGNOSIS — K250 Acute gastric ulcer with hemorrhage: Secondary | ICD-10-CM | POA: Diagnosis not present

## 2021-02-07 DIAGNOSIS — K449 Diaphragmatic hernia without obstruction or gangrene: Secondary | ICD-10-CM | POA: Insufficient documentation

## 2021-02-07 DIAGNOSIS — K2951 Unspecified chronic gastritis with bleeding: Principal | ICD-10-CM | POA: Insufficient documentation

## 2021-02-07 DIAGNOSIS — Z87891 Personal history of nicotine dependence: Secondary | ICD-10-CM | POA: Diagnosis not present

## 2021-02-07 DIAGNOSIS — I700 Atherosclerosis of aorta: Secondary | ICD-10-CM | POA: Diagnosis present

## 2021-02-07 DIAGNOSIS — E7800 Pure hypercholesterolemia, unspecified: Secondary | ICD-10-CM | POA: Diagnosis present

## 2021-02-07 DIAGNOSIS — K921 Melena: Secondary | ICD-10-CM | POA: Insufficient documentation

## 2021-02-07 DIAGNOSIS — Z20822 Contact with and (suspected) exposure to covid-19: Secondary | ICD-10-CM | POA: Diagnosis not present

## 2021-02-07 DIAGNOSIS — I2510 Atherosclerotic heart disease of native coronary artery without angina pectoris: Secondary | ICD-10-CM | POA: Diagnosis present

## 2021-02-07 DIAGNOSIS — B9681 Helicobacter pylori [H. pylori] as the cause of diseases classified elsewhere: Secondary | ICD-10-CM | POA: Insufficient documentation

## 2021-02-07 DIAGNOSIS — D62 Acute posthemorrhagic anemia: Secondary | ICD-10-CM | POA: Diagnosis not present

## 2021-02-07 DIAGNOSIS — R5383 Other fatigue: Secondary | ICD-10-CM | POA: Diagnosis present

## 2021-02-07 DIAGNOSIS — K760 Fatty (change of) liver, not elsewhere classified: Secondary | ICD-10-CM | POA: Diagnosis present

## 2021-02-07 DIAGNOSIS — Z6827 Body mass index (BMI) 27.0-27.9, adult: Secondary | ICD-10-CM

## 2021-02-07 DIAGNOSIS — K2981 Duodenitis with bleeding: Secondary | ICD-10-CM | POA: Insufficient documentation

## 2021-02-07 DIAGNOSIS — F418 Other specified anxiety disorders: Secondary | ICD-10-CM | POA: Diagnosis present

## 2021-02-07 HISTORY — DX: Depression, unspecified: F32.A

## 2021-02-07 LAB — CBG MONITORING, ED: Glucose-Capillary: 153 mg/dL — ABNORMAL HIGH (ref 70–99)

## 2021-02-07 LAB — COMPREHENSIVE METABOLIC PANEL
ALT: 21 U/L (ref 0–44)
AST: 18 U/L (ref 15–41)
Albumin: 3.4 g/dL — ABNORMAL LOW (ref 3.5–5.0)
Alkaline Phosphatase: 51 U/L (ref 38–126)
Anion gap: 8 (ref 5–15)
BUN: 28 mg/dL — ABNORMAL HIGH (ref 6–20)
CO2: 26 mmol/L (ref 22–32)
Calcium: 8.6 mg/dL — ABNORMAL LOW (ref 8.9–10.3)
Chloride: 108 mmol/L (ref 98–111)
Creatinine, Ser: 0.99 mg/dL (ref 0.44–1.00)
GFR, Estimated: >60 mL/min (ref >60)
Glucose, Bld: 127 mg/dL — ABNORMAL HIGH (ref 70–99)
Potassium: 4.3 mmol/L (ref 3.5–5.1)
Sodium: 142 mmol/L (ref 135–145)
Total Bilirubin: 0.6 mg/dL (ref 0.3–1.2)
Total Protein: 5.6 g/dL — ABNORMAL LOW (ref 6.5–8.1)

## 2021-02-07 LAB — CBC
HCT: 30.8 % — ABNORMAL LOW (ref 36.0–46.0)
Hemoglobin: 10 g/dL — ABNORMAL LOW (ref 12.0–15.0)
MCH: 28.7 pg (ref 26.0–34.0)
MCHC: 32.5 g/dL (ref 30.0–36.0)
MCV: 88.3 fL (ref 80.0–100.0)
Platelets: 267 10*3/uL (ref 150–400)
RBC: 3.49 MIL/uL — ABNORMAL LOW (ref 3.87–5.11)
RDW: 13.1 % (ref 11.5–15.5)
WBC: 14.8 10*3/uL — ABNORMAL HIGH (ref 4.0–10.5)
nRBC: 0 % (ref 0.0–0.2)

## 2021-02-07 LAB — PROTIME-INR
INR: 1.1 (ref 0.8–1.2)
Prothrombin Time: 14.1 seconds (ref 11.4–15.2)

## 2021-02-07 LAB — HEMOGLOBIN AND HEMATOCRIT, BLOOD
HCT: 29.9 % — ABNORMAL LOW (ref 36.0–46.0)
Hemoglobin: 9.8 g/dL — ABNORMAL LOW (ref 12.0–15.0)

## 2021-02-07 LAB — TROPONIN I (HIGH SENSITIVITY): Troponin I (High Sensitivity): 3 ng/L (ref <18)

## 2021-02-07 LAB — APTT: aPTT: 31 seconds (ref 24–36)

## 2021-02-07 LAB — ABO/RH: ABO/RH(D): O POS

## 2021-02-07 LAB — PREPARE RBC (CROSSMATCH)

## 2021-02-07 MED ORDER — SODIUM CHLORIDE 0.9 % IV SOLN
8.0000 mg/h | INTRAVENOUS | Status: DC
  Administered 2021-02-07 – 2021-02-08 (×2): 8 mg/h via INTRAVENOUS
  Filled 2021-02-07 (×3): qty 80

## 2021-02-07 MED ORDER — ACETAMINOPHEN 325 MG PO TABS: 325.0000 mg | ORAL_TABLET | Freq: Four times a day (QID) | ORAL | Status: DC | PRN

## 2021-02-07 MED ORDER — PANTOPRAZOLE SODIUM 40 MG IV SOLR: 40.0000 mg | Freq: Two times a day (BID) | INTRAVENOUS | Status: DC

## 2021-02-07 MED ORDER — ESCITALOPRAM OXALATE 10 MG PO TABS
20.0000 mg | ORAL_TABLET | Freq: Every day | ORAL | Status: DC
  Administered 2021-02-07: 20 mg via ORAL
  Filled 2021-02-07 (×2): qty 2

## 2021-02-07 MED ORDER — ACETAMINOPHEN 650 MG RE SUPP: 325.0000 mg | Freq: Four times a day (QID) | RECTAL | Status: DC | PRN

## 2021-02-07 MED ORDER — ONDANSETRON HCL 4 MG/2ML IJ SOLN: 4.0000 mg | Freq: Four times a day (QID) | INTRAMUSCULAR | Status: DC | PRN

## 2021-02-07 MED ORDER — SODIUM CHLORIDE 0.9 % IV SOLN
80.0000 mg | Freq: Once | INTRAVENOUS | Status: AC
  Administered 2021-02-07: 80 mg via INTRAVENOUS
  Filled 2021-02-07: qty 80

## 2021-02-07 MED ORDER — SODIUM CHLORIDE 0.9 % IV BOLUS
1000.0000 mL | Freq: Once | INTRAVENOUS | Status: AC
  Administered 2021-02-07: 1000 mL via INTRAVENOUS

## 2021-02-07 MED ORDER — SODIUM CHLORIDE 0.9% IV SOLUTION
Freq: Once | INTRAVENOUS | Status: AC
  Filled 2021-02-07: qty 250

## 2021-02-07 MED ORDER — ONDANSETRON HCL 4 MG/2ML IJ SOLN
4.0000 mg | Freq: Once | INTRAMUSCULAR | Status: AC
  Administered 2021-02-07: 4 mg via INTRAVENOUS
  Filled 2021-02-07: qty 2

## 2021-02-07 MED ORDER — ONDANSETRON HCL 4 MG PO TABS: 4.0000 mg | ORAL_TABLET | Freq: Four times a day (QID) | ORAL | Status: DC | PRN

## 2021-02-07 MED ORDER — HAIR SKIN AND NAILS FORMULA PO TABS: 1.0000 | ORAL_TABLET | Freq: Every day | ORAL | Status: DC

## 2021-02-07 NOTE — ED Notes (Signed)
MD at bedside. 

## 2021-02-07 NOTE — Telephone Encounter (Signed)
FYI: Patient calling states that she have not been feeling well today she started with black diarrhea stools, had a syncope episode where patients husband had to called EMS to the home when they arrived they checked vitals and preformed EKG recommended patient to go be evaluated at the hospital but patient declined. Patient called at/around 2:50pm stating that she was in our parking lot still not feeling well and not sure what to do. Patient verbally understood there was only one Provider in the office who is completely booked and we do not take walk ins. Advised patient that she needed to go to ED to be evaluated due to black stools and symptoms there was a possibility that she could have something going on internally. Patient ask what would be the effects if she did not go to ED informed patient that, it wasn't my call but I do advise her to be seen as soon as possible with her current symptoms. They are concerning enough for her to not just go home.

## 2021-02-07 NOTE — ED Notes (Signed)
Patients husband and Dr. Derrill Kay at bedside

## 2021-02-07 NOTE — ED Provider Notes (Signed)
[facility] Emergency Department Provider Note   ____________________________________________   I have reviewed the triage vital signs and the nursing notes.   HISTORY  Chief Complaint Loss of Consciousness, Emesis, and GI Bleeding   History limited by: Not Limited   HPI Rachel Arnold is a 60 y.o. female who presents to the emergency department today because of concern for a syncopal episode and black emesis and bowel movement. For the past couple of weeks the patient states that she has felt nauseous. Today after eating lunch she had a large black watery bowel movement. She then went down to watch a movie and when she awoke she went to get up and passed out. She also had episode of vomiting with black emesis. The patient passed out again in the waiting room here. The patient denies any OTC pain medication or alcohol use. Denies any unusual ingestions recently. States she had a colonoscopy roughly 10 years ago without any concerning findings.   Records reviewed. Per medical record review patient has a history of irritable bowel syndrome.   Past Medical History:  Diagnosis Date  . Seasonal mood disorder ([facility])   . Thyroid goiter     Patient Active Problem List   Diagnosis Date Noted  . Elevated LDL cholesterol level 10/04/2020  . Anxiety with depression 09/05/2020  . Need for influenza vaccination 09/05/2020  . Perioral dermatitis 09/05/2020  . Fatigue 09/05/2020  . Healthcare maintenance 09/05/2020  . Hepatic steatosis 09/05/2020  . Epigastric pain 07/31/2020  . Diarrhea 07/31/2020  . Irritable bowel syndrome with diarrhea 07/31/2020  . Coronary artery calcification seen on CT scan 07/31/2020  . NCGS (non-celiac gluten sensitivity) 07/31/2020  . Acute bronchitis 02/10/2019  . Right thyroid nodule 01/03/2019  . Aortic atherosclerosis ([facility]) 01/03/2019  . Nasal lesion 01/03/2019  . Microscopic hematuria 10/13/2018  . Smoker 10/13/2018  .  Osteoporosis 10/13/2018  . Body mass index (bmi) 27.0-27.9, adult 10/13/2018  . Family history of diabetes mellitus 10/13/2018  . Family history of cancer 10/13/2018    Past Surgical History:  Procedure Laterality Date  . stress myoview  12/22/2008   EF 69%; LV norm.    Prior to Admission medications   Medication Sig Start Date End Date Taking? Authorizing Provider  escitalopram (LEXAPRO) 20 MG tablet Take 1 tablet (20 mg total) by mouth daily. 12/27/20   Cirigliano, Jearld Lesch, DO  HYDROcodone-acetaminophen (NORCO/VICODIN) 5-325 MG tablet Take 1 tablet by mouth every 6 (six) hours as needed. 12/25/20   [provider]  Multiple Vitamins-Minerals (HAIR SKIN AND NAILS FORMULA) TABS Take by mouth.    [provider]  ondansetron (ZOFRAN-ODT) 4 MG disintegrating tablet SMARTSIG:1 Tablet(s) By Mouth 4-5 Times Daily 12/25/20   [provider]  valACYclovir (VALTREX) 500 MG tablet Take 500 mg by mouth daily. 12/25/20   [provider]    Allergies Penicillins  Family History  Problem Relation Age of Onset  . Congestive Heart Failure Mother   . Coronary artery disease Mother   . Diabetes Mother   . Hypertension Mother   . Cancer Father   . Cancer Brother        Metastatic  . Coronary artery disease Maternal Grandmother   . Diabetes Maternal Grandmother   . Hypertension Maternal Grandmother   . Early death Brother   . Drug abuse Brother     Social History Social History   Tobacco Use  . Smoking status: Former Smoker    Packs/day: 0.00  Years: 20.00    Pack years: 0.00    Quit date: 08/08/2020    Years since quitting: 0.5  . Smokeless tobacco: Never Used  . Tobacco comment: chantix prescribed  04/12/2012  Vaping Use  . Vaping Use: Never used  Substance Use Topics  . Alcohol use: Not Currently    Comment: Occas  . Drug use: Never    Review of Systems Constitutional: No fever/chills Eyes: No visual changes. ENT: No sore  throat. Cardiovascular: Denies chest pain. Respiratory: Denies shortness of breath. Gastrointestinal: Positive for black stool and emesis.  Genitourinary: Negative for dysuria. Musculoskeletal: Negative for back pain. Skin: Negative for rash. Neurological: Positive for syncope.  ____________________________________________   PHYSICAL EXAM:  VITAL SIGNS: ED Triage Vitals  Enc Vitals Group     BP 02/07/21 1539 92/71     Pulse Rate 02/07/21 1539 81     Resp 02/07/21 1539 18     Temp --      Temp src --      SpO2 02/07/21 1539 100 %     Weight 02/07/21 1542 175 lb (79.4 kg)     Height 02/07/21 1542 5\' 7"  (1.702 m)     Head Circumference --      Peak Flow --      Pain Score 02/07/21 1542 0    Constitutional: Alert and oriented.  Eyes: Conjunctivae are normal.  ENT      Head: Normocephalic and atraumatic.      Nose: No congestion/rhinnorhea.      Mouth/Throat: Mucous membranes are moist.      Neck: No stridor. Hematological/Lymphatic/Immunilogical: No cervical lymphadenopathy. Cardiovascular: Normal rate, regular rhythm.  No murmurs, rubs, or gallops.  Respiratory: Normal respiratory effort without tachypnea nor retractions. Breath sounds are clear and equal bilaterally. No wheezes/rales/rhonchi. Gastrointestinal: Soft and non tender. No rebound. No guarding.  Genitourinary: Deferred Musculoskeletal: Normal range of motion in all extremities. No lower extremity edema. Neurologic:  Normal speech and language. No gross focal neurologic deficits are appreciated.  Skin:  Diaphoretic.  Psychiatric: Mood and affect are normal. Speech and behavior are normal. Patient exhibits appropriate insight and judgment.  ____________________________________________    LABS (pertinent positives/negatives)  CBC wbc 14.8, hgb 10.0, plt 267 INR 1.1 PTT 31 CMP na 142, k 4.3, glu 127, cr 0.99 ____________________________________________   EKG  I, 02/09/21, attending physician,  personally viewed and interpreted this EKG  EKG Time: 1537 Rate: 92 Rhythm: normal sinus rhythm Axis: normal Intervals: qtc 435 QRS: narrow ST changes: no st elevation Impression: normal ekg ____________________________________________    RADIOLOGY  None  ____________________________________________   PROCEDURES  Procedures  CRITICAL CARE Performed by: Phineas Semen   Total critical care time: 30 minutes  Critical care time was exclusive of separately billable procedures and treating other patients.  Critical care was necessary to treat or prevent imminent or life-threatening deterioration.  Critical care was time spent personally by me on the following activities: development of treatment plan with patient and/or surrogate as well as nursing, discussions with consultants, evaluation of patient's response to treatment, examination of patient, obtaining history from patient or surrogate, ordering and performing treatments and interventions, ordering and review of laboratory studies, ordering and review of radiographic studies, pulse oximetry and re-evaluation of patient's condition.  ____________________________________________   INITIAL IMPRESSION / ASSESSMENT AND PLAN / ED COURSE  Pertinent labs & imaging results that were available during my care of the patient were reviewed by me and considered in my  medical decision making (see chart for details).   Patient presented to the emergency department today because of concerns for syncopal episodes as well as black stool and emesis.  Patient's blood pressure initially was low here.  Given reported history of black emesis and stool did have concerns for acute GI bleed.  Initial blood work did show hemoglobin of 10 which was lower than the hemoglobin of 13 she had roughly 1 month ago.  Because of the syncope and low blood pressure and concern for continued GI bleed I did consent the patient and ordered 1 unit of emergency  release blood.  Patient was also given IV fluids and nausea medication.  After the first unit of blood she did appear much better.  Blood pressure had improved.  I discussed with Dr. Tobi Bastos with GI.  Will plan on admission to the hospital service.  ___________________________________________   FINAL CLINICAL IMPRESSION(S) / ED DIAGNOSES  Final diagnoses:  Gastrointestinal hemorrhage, unspecified gastrointestinal hemorrhage type  Syncope, unspecified syncope type     Note: This dictation was prepared with Dragon dictation. Any transcriptional errors that result from this process are unintentional     Phineas Semen, MD 02/07/21 1950

## 2021-02-07 NOTE — ED Notes (Signed)
Patient feeling much better, Dr. Derrill Kay at bedside to update. 1 unit of blood completed.

## 2021-02-07 NOTE — ED Notes (Signed)
Dr. Cox at bedside.  

## 2021-02-07 NOTE — H&P (Signed)
History and Physical   ANSLEIGH SAFER [id number] DOB: 12-16-1960 DOA: 02/07/2021  PCP: Mliss Sax, MD  Patient coming from: home via EMS  I have personally briefly reviewed patient's old medical records in [facility] EMR.  Chief Concern: Syncope and coffee-ground emesis  HPI: Rachel Arnold is a 60 y.o. female with medical history significant for anxiety/depression, hepatic steatosis, history of alcohol abuse, history of tobacco abuse, presented to the emergency department for chief concerns of dark brown vomitus and black stool and syncope.  She endorses associated shortness of breath that has resolved at this time.  She endorses dark brown vomitus, unconscousness , vomited 3 x. One bowel movement before feeling weak and passing out, and she states it was black. She states she has had black stool when she's had a full bottle of red wine, 4 years ago. She has had not had etoh for three years. She quit smoking 08/08/20.  She reports that after her dark black bowel movement, she vomited 3 times, and that she felt really weak she sat down against the door frame and put her head against the door frame and that she lost consciousness and did not know what happened after.  Her husband heard a thump on the hardwood floor and came in and saw her passed out with black vomitus at her mouth.  She reports epiastric pain and discomfort, gas for years and worsening for the last 3 weeks. She states the epigastric pain/discomfort is improved with food intake and comes back after several hours. She endorses attempting to take over-the-counter probiotic to improve her symptoms with minimal improvement.  She denies BC powder, goody powder, naproxen, ibuprofen use. She denies etoh use in 3.5 years.  Last meal was 11:30 Am, chicken pot pie and chocolate chest pie  Social: she formerly smoked 2 ppd, started at 60 years old and quit 08/08/20. She denies recreational drug use.  Formerly, she consumed  alcohol almost daily, and endorses drinking at least 12 cocktails per week.  She reports that she has not had a drink of alcohol in 3.5 years and this was precipitated by her daughter having children.  At bedside, patient states that she feels much better with the unit of blood that has been ordered.  She was able to tell me her name, age, current location of hospital, and identified husband at bedside, and current calendar year.  ROS: Constitutional: no weight change, no fever ENT/Mouth: no sore throat, no rhinorrhea Eyes: no eye pain, no vision changes Cardiovascular: no chest pain, + dyspnea,  no edema, no palpitations Respiratory: no cough, no sputum, no wheezing Gastrointestinal: + nausea, + vomiting, + diarrhea, no constipation Genitourinary: no urinary incontinence, no dysuria, no hematuria Musculoskeletal: no arthralgias, no myalgias Skin: no skin lesions, no pruritus, Neuro: + weakness, + loss of consciousness, + syncope Psych: + anxiety, + depression, decrease appetite, denies SI and HI Heme/Lymph: no bruising, + GI bleeding  ED Course: Discussed with ED provider, patient requiring hospitalization due to suspicion for upper GI bleed.  Vitals in the emergency room show temperature of 97.6, respiration rate of 18, heart rate of 67, blood pressure 102/65, satting at 96% on room air.  Assessment/Plan  Principal Problem:   Upper GI bleed Active Problems:   Body mass index (BMI) of 27.0 to 27.9 in adult   Aortic atherosclerosis (HCC)   Coronary artery calcification seen on CT scan   Anxiety with depression   Fatigue   Hepatic steatosis  Elevated LDL cholesterol level   Syncope   Melena   Coffee ground emesis   Coffee-ground emesis and melena-suspect secondary to upper GI bleed suspect gastric versus duodenal ulcer -Gastroenterology specialist has been consulted, Dr. Tobi Bastos -N.p.o. except for sips with meds with ice at this time -N.p.o. at midnight -Status post 1 unit  packed red blood cells per ED provider -Repeat CBC 30 minutes after completion of blood -Continue Protonix GTT -CBC in the a.m.  Leukocytosis-suspect reactive to anemia, CBC in the a.m.  Depression/anxiety-denies SI, HI at this time -Resumed home escitalopram 20 mg nightly  Chart reviewed.   Patient had rhinoplasty, face and neck lift, face CO2 laser on 01/08/2021 with Dr. Hildred Priest  DVT prophylaxis: TED hose, no pharmacologic DVT prophylaxis due to upper GI bleed presentation requiring PRBC Code Status: Full code  Diet: N.p.o. Family Communication: Updated spouse at bedside Disposition Plan: Pending clinical course Consults called: Gastroenterologist Admission status: Observation to progressive cardiac with telemetry  Past Medical History:  Diagnosis Date  . Depression   . Seasonal mood disorder (HCC)   . Thyroid goiter    Past Surgical History:  Procedure Laterality Date  . FACIAL COSMETIC SURGERY  12/2020  . RHINOPLASTY  12/2020  . stress myoview  12/22/2008   EF 69%; LV norm.   Social History:  reports that she quit smoking about 6 months ago. Her smoking use included cigarettes. She smoked 0.00 packs per day for 20.00 years. She has never used smokeless tobacco. She reports that she does not drink alcohol and does not use drugs.  Allergies  Allergen Reactions  . Penicillins    Family History  Problem Relation Age of Onset  . Congestive Heart Failure Mother   . Coronary artery disease Mother   . Diabetes Mother   . Hypertension Mother   . Cancer Father   . Cancer Brother        Metastatic  . Coronary artery disease Maternal Grandmother   . Diabetes Maternal Grandmother   . Hypertension Maternal Grandmother   . Early death Brother   . Drug abuse Brother    Family history: Family history reviewed and not pertinent  Prior to Admission medications   Medication Sig Start Date End Date Taking? Authorizing Provider  escitalopram (LEXAPRO) 20 MG tablet Take 1  tablet (20 mg total) by mouth daily. 12/27/20   Cirigliano, Jearld Lesch, DO  Multiple Vitamins-Minerals (HAIR SKIN AND NAILS FORMULA) TABS Take by mouth.    [provider]   Physical Exam: Vitals:   02/07/21 1630 02/07/21 1718 02/07/21 1745 02/07/21 2043  BP: 105/72 100/66 102/65 115/74  Pulse: (!) 59 61 67 61  Resp: 16 18 18 16   Temp: 97.6 F (36.4 C)   (!) 97.5 F (36.4 C)  TempSrc: Oral   Oral  SpO2: 99% 96% 96% 100%  Weight:      Height:       Constitutional: appears age-appropriate, NAD, calm, comfortable Eyes: PERRL, lids and conjunctivae normal ENMT: Mucous membranes are moist. Posterior pharynx clear of any exudate or lesions. Age-appropriate dentition. Hearing appropriate Neck: normal, supple, no masses, no thyromegaly Respiratory: clear to auscultation bilaterally, no wheezing, no crackles. Normal respiratory effort. No accessory muscle use.  Cardiovascular: Regular rate and rhythm, no murmurs / rubs / gallops. No extremity edema. 2+ pedal pulses. No carotid bruits.  Abdomen: no tenderness, no masses palpated, no hepatosplenomegaly. Bowel sounds positive.  Musculoskeletal: no clubbing / cyanosis. No joint deformity upper and lower  extremities. Good ROM, no contractures, no atrophy. Normal muscle tone.  Skin: no rashes, lesions, ulcers. No induration Neurologic: Sensation intact. Strength 5/5 in all 4.  Psychiatric: Normal judgment and insight. Alert and oriented x 3. Normal mood.   EKG: independently reviewed, showing sinus rhythm with rate of 92, QTc 435  Chest x-ray on Admission: Not indicated  Labs on Admission: I have personally reviewed following labs  CBC: Recent Labs  Lab 02/07/21 1550 02/07/21 1907  WBC 14.8*  --   HGB 10.0* 9.8*  HCT 30.8* 29.9*  MCV 88.3  --   PLT 267  --    Basic Metabolic Panel: Recent Labs  Lab 02/07/21 1550  NA 142  K 4.3  CL 108  CO2 26  GLUCOSE 127*  BUN 28*  CREATININE 0.99  CALCIUM 8.6*   GFR: Estimated  Creatinine Clearance: 66.4 mL/min (by C-G formula based on SCr of 0.99 mg/dL). Liver Function Tests: Recent Labs  Lab 02/07/21 1550  AST 18  ALT 21  ALKPHOS 51  BILITOT 0.6  PROT 5.6*  ALBUMIN 3.4*   No results for input(s): LIPASE, AMYLASE in the last 168 hours. No results for input(s): AMMONIA in the last 168 hours. Coagulation Profile: Recent Labs  Lab 02/07/21 1550  INR 1.1   Cardiac Enzymes: No results for input(s): CKTOTAL, CKMB, CKMBINDEX, TROPONINI in the last 168 hours. BNP (last 3 results) No results for input(s): PROBNP in the last 8760 hours. HbA1C: No results for input(s): HGBA1C in the last 72 hours. CBG: Recent Labs  Lab 02/07/21 1555  GLUCAP 153*   Lipid Profile: No results for input(s): CHOL, HDL, LDLCALC, TRIG, CHOLHDL, LDLDIRECT in the last 72 hours. Thyroid Function Tests: No results for input(s): TSH, T4TOTAL, FREET4, T3FREE, THYROIDAB in the last 72 hours. Anemia Panel: No results for input(s): VITAMINB12, FOLATE, FERRITIN, TIBC, IRON, RETICCTPCT in the last 72 hours. Urine analysis:    Component Value Date/Time   COLORURINE YELLOW 09/11/2020 0802   APPEARANCEUR CLEAR 09/11/2020 0802   LABSPEC 1.020 09/11/2020 0802   PHURINE 6.0 09/11/2020 0802   GLUCOSEU NEGATIVE 09/11/2020 0802   HGBUR MODERATE (A) 09/11/2020 0802   BILIRUBINUR NEGATIVE 09/11/2020 0802   KETONESUR NEGATIVE 09/11/2020 0802   UROBILINOGEN 0.2 09/11/2020 0802   NITRITE NEGATIVE 09/11/2020 0802   LEUKOCYTESUR NEGATIVE 09/11/2020 0802    Amy N Cox D.O. [facility]  If 7PM-7AM, please contact overnight-coverage provider If 7AM-7PM, please contact day coverage provider [email or web address]  02/07/2021, 11:10 PM

## 2021-02-07 NOTE — Telephone Encounter (Signed)
Pt absolutely needs eval in ER. On review of chart pt has arrived at ED

## 2021-02-07 NOTE — ED Triage Notes (Signed)
Pt comes into the ED via POV c/o syncopal episode and N/V.  Pt denies that this has ever happened before.  Pt states she had just gotten back in bed from having a BM where she had black liquid stool.  Pt states her emesis also was black in color.  Pt has even and unlabored respirations at this time and is in NAD.

## 2021-02-07 NOTE — ED Triage Notes (Signed)
First Nurse Note:  C/O syncopal episode just PTA and rectal bleeding and vomiting blood.  AAOx3.  Skin pale, warm and dry.  Patient placed in a wheelchair for safety.

## 2021-02-07 NOTE — ED Notes (Signed)
Patient vomited approx dark black liquid, patient reports she has had 3 prior episodes of same earlier today.

## 2021-02-08 ENCOUNTER — Encounter
Admission: EM | Disposition: A | Discharge status: Home / Self Care | Payer: Self-pay | Source: Home / Self Care | Attending: Emergency Medicine

## 2021-02-08 ENCOUNTER — Observation Stay: Payer: 59 | Admitting: Anesthesiology

## 2021-02-08 ENCOUNTER — Other Ambulatory Visit: Payer: Self-pay | Admitting: Gastroenterology

## 2021-02-08 ENCOUNTER — Encounter: Payer: Self-pay | Admitting: Internal Medicine

## 2021-02-08 DIAGNOSIS — K920 Hematemesis: Secondary | ICD-10-CM

## 2021-02-08 DIAGNOSIS — K921 Melena: Secondary | ICD-10-CM

## 2021-02-08 DIAGNOSIS — K279 Peptic ulcer, site unspecified, unspecified as acute or chronic, without hemorrhage or perforation: Secondary | ICD-10-CM

## 2021-02-08 DIAGNOSIS — K922 Gastrointestinal hemorrhage, unspecified: Secondary | ICD-10-CM

## 2021-02-08 DIAGNOSIS — D62 Acute posthemorrhagic anemia: Secondary | ICD-10-CM

## 2021-02-08 HISTORY — PX: ESOPHAGOGASTRODUODENOSCOPY (EGD) WITH PROPOFOL: SHX5813

## 2021-02-08 LAB — SARS CORONAVIRUS 2 (TAT 6-24 HRS): SARS Coronavirus 2: NEGATIVE

## 2021-02-08 LAB — IRON AND TIBC
Iron: 86 ug/dL (ref 28–170)
Saturation Ratios: 30 % (ref 10.4–31.8)
TIBC: 283 ug/dL (ref 250–450)
UIBC: 197 ug/dL

## 2021-02-08 LAB — BPAM RBC
Blood Product Expiration Date: 202203042359
Blood Product Expiration Date: 202203042359
ISSUE DATE / TIME: 202202241622
ISSUE DATE / TIME: 202202251108
Unit Type and Rh: 5100
Unit Type and Rh: 5100

## 2021-02-08 LAB — BASIC METABOLIC PANEL
Anion gap: 5 (ref 5–15)
BUN: 28 mg/dL — ABNORMAL HIGH (ref 6–20)
CO2: 25 mmol/L (ref 22–32)
Calcium: 8.4 mg/dL — ABNORMAL LOW (ref 8.9–10.3)
Chloride: 112 mmol/L — ABNORMAL HIGH (ref 98–111)
Creatinine, Ser: 0.77 mg/dL (ref 0.44–1.00)
GFR, Estimated: >60 mL/min (ref >60)
Glucose, Bld: 92 mg/dL (ref 70–99)
Potassium: 3.8 mmol/L (ref 3.5–5.1)
Sodium: 142 mmol/L (ref 135–145)

## 2021-02-08 LAB — TYPE AND SCREEN
ABO/RH(D): O POS
Antibody Screen: NEGATIVE
Unit division: 0
Unit division: 0

## 2021-02-08 LAB — CBC
HCT: 29 % — ABNORMAL LOW (ref 36.0–46.0)
Hemoglobin: 10 g/dL — ABNORMAL LOW (ref 12.0–15.0)
MCH: 29.3 pg (ref 26.0–34.0)
MCHC: 34.5 g/dL (ref 30.0–36.0)
MCV: 85 fL (ref 80.0–100.0)
Platelets: 197 10*3/uL (ref 150–400)
RBC: 3.41 MIL/uL — ABNORMAL LOW (ref 3.87–5.11)
RDW: 13.8 % (ref 11.5–15.5)
WBC: 9.7 10*3/uL (ref 4.0–10.5)
nRBC: 0 % (ref 0.0–0.2)

## 2021-02-08 LAB — FERRITIN: Ferritin: 28 ng/mL (ref 11–307)

## 2021-02-08 LAB — FOLATE: Folate: 9.3 ng/mL (ref >5.9)

## 2021-02-08 LAB — VITAMIN B12: Vitamin B-12: 381 pg/mL (ref 180–914)

## 2021-02-08 SURGERY — ESOPHAGOGASTRODUODENOSCOPY (EGD) WITH PROPOFOL
Anesthesia: General

## 2021-02-08 MED ORDER — PROPOFOL 10 MG/ML IV BOLUS
INTRAVENOUS | Status: DC | PRN
  Administered 2021-02-08: 70 mg via INTRAVENOUS

## 2021-02-08 MED ORDER — OMEPRAZOLE 40 MG PO CPDR: 40.0000 mg | DELAYED_RELEASE_CAPSULE | Freq: Two times a day (BID) | ORAL | 2 refills | Status: DC

## 2021-02-08 MED ORDER — SODIUM CHLORIDE 0.9 % IV SOLN: INTRAVENOUS | Status: DC

## 2021-02-08 MED ORDER — ONDANSETRON HCL 4 MG/2ML IJ SOLN
INTRAMUSCULAR | Status: AC
  Administered 2021-02-08: 4 mg
  Filled 2021-02-08: qty 2

## 2021-02-08 MED ORDER — FUSION PLUS PO CAPS: 1.0000 | ORAL_CAPSULE | Freq: Every day | ORAL | 2 refills | Status: AC

## 2021-02-08 MED ORDER — PROPOFOL 500 MG/50ML IV EMUL
INTRAVENOUS | Status: DC | PRN
  Administered 2021-02-08: 150 ug/kg/min via INTRAVENOUS

## 2021-02-08 MED ORDER — PROPOFOL 10 MG/ML IV BOLUS
INTRAVENOUS | Status: AC
  Filled 2021-02-08: qty 40

## 2021-02-08 MED ORDER — LIDOCAINE HCL (CARDIAC) PF 100 MG/5ML IV SOSY
PREFILLED_SYRINGE | INTRAVENOUS | Status: DC | PRN
  Administered 2021-02-08: 50 mg via INTRAVENOUS

## 2021-02-08 NOTE — Discharge Summary (Signed)
Physician Discharge Summary  Rachel Arnold WPY:[id number] DOB: May 19, 1961 DOA: 02/07/2021  PCP: Mliss Sax, MD  Admit date: 02/07/2021 Discharge date: 02/08/2021  Admitted From: Home Disposition: Home  Recommendations for Outpatient Follow-up:  1. Follow up with PCP in 1-2 weeks 2. Follow-up with gastroenterology in 1 month 3. Please obtain BMP/CBC in one week 4. Please follow up on the following pending results: Biopsy results.  Home Health: No Equipment/Devices: None Discharge Condition: Stable CODE STATUS: Full Diet recommendation: Heart Healthy   Brief/Interim Summary: Rachel Arnold is a 60 y.o. female with medical history significant for anxiety/depression, hepatic steatosis, history of alcohol abuse, history of tobacco abuse, presented to the emergency department for chief concerns of dark brown vomitus and black stool and syncope.  History of coffee-ground emesis x3 and black tarry stools, she passed out momentarily after having a bowel movement.  CT head without any acute abnormality. Patient quit alcohol 3 years ago and quit smoking last year. Denies any NSAID use.  Hemoglobin was at 10 which was decreased from her baseline of thirteen 1 month ago.  Received 1 unit of PRBC. She was taken for EGD by GI, found to have inflammation in early duodenum and to clean-based, nonbleeding ulcers in stomach.  Biopsies were taken for H. pylori-pending results.  Per GI patient can be discharged home and will take omeprazole 40 mg twice daily for 29-month and will follow up with them as an outpatient.  She can continue rest of her home meds and follow-up with her providers.  Discharge Diagnoses:  Principal Problem:   Upper GI bleed Active Problems:   Body mass index (BMI) of 27.0 to 27.9 in adult   Aortic atherosclerosis (HCC)   Coronary artery calcification seen on CT scan   Anxiety with depression   Fatigue   Hepatic steatosis   Elevated LDL cholesterol level    Syncope   Melena   Coffee ground emesis   Discharge Instructions  Discharge Instructions    Diet - low sodium heart healthy   Complete by: As directed    Discharge instructions   Complete by: As directed    It was pleasure taking care of you. Your gastroenterologist recommending starting omeprazole at 40 mg twice a day as they found clean base ulcers in your stomach, you will take this for next 2 to 30-month and follow-up with them in 2 months for further recommendations. Avoid aspirin, ibuprofen or Aleve. Avoid excessive amount of coffee   Increase activity slowly   Complete by: As directed      Allergies as of 02/08/2021      Reactions   Penicillins       Medication List    TAKE these medications   escitalopram 20 MG tablet Commonly known as: LEXAPRO Take 1 tablet (20 mg total) by mouth daily. What changed: when to take this   Fusion Plus Caps Take 1 capsule by mouth daily.   Hair Skin and Nails Formula Tabs Take 1 tablet by mouth daily.   omeprazole 40 MG capsule Commonly known as: PRILOSEC Take 1 capsule (40 mg total) by mouth in the morning and at bedtime.   Phazyme 125 MG chewable tablet Generic drug: simethicone Chew 125 mg by mouth every 6 (six) hours as needed for flatulence.   saccharomyces boulardii 250 MG capsule Commonly known as: FLORASTOR Take 250 mg by mouth daily.       Follow-up Information    Mliss Sax, MD Follow up.  Specialty: Family Medicine Contact information: [address] [place] [address] [phone]        Toney Reil, MD Follow up in 1 month(s).   Specialty: Gastroenterology Contact information: [address] [place] [address] [phone]              Allergies  Allergen Reactions  . Penicillins     Consultations:  GI  Procedures/Studies: CT HEAD WO CONTRAST  Result Date: 02/08/2021 CLINICAL DATA:  Syncopal episode.  Nausea and vomiting. EXAM: CT HEAD  WITHOUT CONTRAST TECHNIQUE: Contiguous axial images were obtained from the base of the skull through the vertex without intravenous contrast. COMPARISON:  None. FINDINGS: Brain: The brain shows a normal appearance without evidence of malformation, atrophy, old or acute small or large vessel infarction, mass lesion, hemorrhage, hydrocephalus or extra-axial collection. Vascular: No hyperdense vessel. No evidence of atherosclerotic calcification. Skull: Normal.  No traumatic finding.  No focal bone lesion. Sinuses/Orbits: Sinuses are clear. Orbits appear normal. Mastoids are clear. Other: None significant IMPRESSION: Normal head CT. Electronically Signed   By: Paulina Fusi M.D.   On: 02/08/2021 00:03     Subjective: Patient was lying down comfortably when seen during morning rounds.  Waiting for EGD.  No more coffee-ground emesis or melena.  Denies any abdominal pain, mild epigastric discomfort but no tenderness. Patient drinks coffee all day long.  No NSAID use.  Quit alcohol and illicit substance few years ago.  Discharge Exam: Vitals:   02/08/21 1342 02/08/21 1351  BP: 118/69 108/72  Pulse: 65 70  Resp: (!) 21 15  Temp:    SpO2: 98% 98%   Vitals:   02/08/21 1323 02/08/21 1332 02/08/21 1342 02/08/21 1351  BP: (!) 85/52 114/73 118/69 108/72  Pulse: 70 68 65 70  Resp: 19 17 (!) 21 15  Temp:      TempSrc:      SpO2: 96% 98% 98% 98%  Weight:      Height:        General: Pt is alert, awake, not in acute distress Cardiovascular: RRR, S1/S2 +, no rubs, no gallops Respiratory: CTA bilaterally, no wheezing, no rhonchi Abdominal: Soft, NT, ND, bowel sounds + Extremities: no edema, no cyanosis   The results of significant diagnostics from this hospitalization (including imaging, microbiology, ancillary and laboratory) are listed below for reference.    Microbiology: Recent Results (from the past 240 hour(s))  SARS CORONAVIRUS 2 (TAT 6-24 HRS) Nasopharyngeal Nasopharyngeal Swab     Status:  None   Collection Time: 02/07/21  5:18 PM   Specimen: Nasopharyngeal Swab  Result Value Ref Range Status   SARS Coronavirus 2 NEGATIVE NEGATIVE Final    Comment: (NOTE) SARS-CoV-2 target nucleic acids are NOT DETECTED.  The SARS-CoV-2 RNA is generally detectable in upper and lower respiratory specimens during the acute phase of infection. Negative results do not preclude SARS-CoV-2 infection, do not rule out co-infections with other pathogens, and should not be used as the sole basis for treatment or other patient management decisions. Negative results must be combined with clinical observations, patient history, and epidemiological information. The expected result is Negative.  Fact Sheet for Patients: HairSlick.no  Fact Sheet for Healthcare Providers: quierodirigir.com  This test is not yet approved or cleared by the [place] FDA and  has been authorized for detection and/or diagnosis of SARS-CoV-2 by FDA under an Emergency Use Authorization (EUA). This EUA will remain  in effect (meaning this test can be used) for  the duration of the COVID-19 declaration under Se ction 564(b)(1) of the Act, 21 U.S.C. section 360bbb-3(b)(1), unless the authorization is terminated or revoked sooner.  Performed at [facility], [address] [place], [place] [id number]      Labs: BNP (last 3 results) No results for input(s): BNP in the last 8760 hours. Basic Metabolic Panel: Recent Labs  Lab 02/07/21 1550 02/08/21 0526  NA 142 142  K 4.3 3.8  CL 108 112*  CO2 26 25  GLUCOSE 127* 92  BUN 28* 28*  CREATININE 0.99 0.77  CALCIUM 8.6* 8.4*   Liver Function Tests: Recent Labs  Lab 02/07/21 1550  AST 18  ALT 21  ALKPHOS 51  BILITOT 0.6  PROT 5.6*  ALBUMIN 3.4*   No results for input(s): LIPASE, AMYLASE in the last 168 hours. No results for input(s): AMMONIA in the last 168 hours. CBC: Recent Labs  Lab  02/07/21 1550 02/07/21 1907 02/08/21 0526  WBC 14.8*  --  9.7  HGB 10.0* 9.8* 10.0*  HCT 30.8* 29.9* 29.0*  MCV 88.3  --  85.0  PLT 267  --  197   Cardiac Enzymes: No results for input(s): CKTOTAL, CKMB, CKMBINDEX, TROPONINI in the last 168 hours. BNP: Invalid input(s): POCBNP CBG: Recent Labs  Lab 02/07/21 1555  GLUCAP 153*   D-Dimer No results for input(s): DDIMER in the last 72 hours. Hgb A1c No results for input(s): HGBA1C in the last 72 hours. Lipid Profile No results for input(s): CHOL, HDL, LDLCALC, TRIG, CHOLHDL, LDLDIRECT in the last 72 hours. Thyroid function studies No results for input(s): TSH, T4TOTAL, T3FREE, THYROIDAB in the last 72 hours.  Invalid input(s): FREET3 Anemia work up Recent Labs    02/08/21 0518  FOLATE 9.3  FERRITIN 28  TIBC 283  IRON 86   Urinalysis    Component Value Date/Time   COLORURINE YELLOW 09/11/2020 0802   APPEARANCEUR CLEAR 09/11/2020 0802   LABSPEC 1.020 09/11/2020 0802   PHURINE 6.0 09/11/2020 0802   GLUCOSEU NEGATIVE 09/11/2020 0802   HGBUR MODERATE (A) 09/11/2020 0802   BILIRUBINUR NEGATIVE 09/11/2020 0802   KETONESUR NEGATIVE 09/11/2020 0802   UROBILINOGEN 0.2 09/11/2020 0802   NITRITE NEGATIVE 09/11/2020 0802   LEUKOCYTESUR NEGATIVE 09/11/2020 0802   Sepsis Labs Invalid input(s): PROCALCITONIN,  WBC,  LACTICIDVEN Microbiology Recent Results (from the past 240 hour(s))  SARS CORONAVIRUS 2 (TAT 6-24 HRS) Nasopharyngeal Nasopharyngeal Swab     Status: None   Collection Time: 02/07/21  5:18 PM   Specimen: Nasopharyngeal Swab  Result Value Ref Range Status   SARS Coronavirus 2 NEGATIVE NEGATIVE Final    Comment: (NOTE) SARS-CoV-2 target nucleic acids are NOT DETECTED.  The SARS-CoV-2 RNA is generally detectable in upper and lower respiratory specimens during the acute phase of infection. Negative results do not preclude SARS-CoV-2 infection, do not rule out co-infections with other pathogens, and should not  be used as the sole basis for treatment or other patient management decisions. Negative results must be combined with clinical observations, patient history, and epidemiological information. The expected result is Negative.  Fact Sheet for Patients: HairSlick.nohttps://www.fda.gov/media/138098/download  Fact Sheet for Healthcare Providers: quierodirigir.comhttps://www.fda.gov/media/138095/download  This test is not yet approved or cleared by the [place] FDA and  has been authorized for detection and/or diagnosis of SARS-CoV-2 by FDA under an Emergency Use Authorization (EUA). This EUA will remain  in effect (meaning this test can be used) for the duration of the COVID-19 declaration under Se ction  564(b)(1) of the Act, 21 U.S.C. section 360bbb-3(b)(1), unless the authorization is terminated or revoked sooner.  Performed at [facility], [address], [place], [place] [address]     Time coordinating discharge: Over 30 minutes  SIGNED:  Arnetha Courser, MD  [facility] 02/08/2021, 2:09 PM  If 7PM-7AM, please contact night-coverage [email or web address]  This record has been created using Conservation officer, historic buildings. Errors have been sought and corrected,but may not always be located. Such creation errors do not reflect on the standard of care.

## 2021-02-08 NOTE — Transfer of Care (Signed)
Immediate Anesthesia Transfer of Care Note  Patient: Rachel Arnold  Procedure(s) Performed: ESOPHAGOGASTRODUODENOSCOPY (EGD) WITH PROPOFOL (N/A )  Patient Location: Endoscopy Unit  Anesthesia Type:General  Level of Consciousness: drowsy  Airway & Oxygen Therapy: Patient Spontanous Breathing  Post-op Assessment: Report given to RN and Post -op Vital signs reviewed and stable  Post vital signs: Reviewed and stable  Last Vitals:  Vitals Value Taken Time  BP 85/52 02/08/21 1323  Temp 36.4 C 02/08/21 1322  Pulse 70 02/08/21 1323  Resp 19 02/08/21 1323  SpO2 96 % 02/08/21 1323    Last Pain:  Vitals:   02/08/21 1322  TempSrc: Temporal  PainSc: 0-No pain         Complications: No complications documented.

## 2021-02-08 NOTE — Anesthesia Preprocedure Evaluation (Addendum)
Anesthesia Evaluation  Patient identified by MRN, date of birth, ID band Patient awake    Reviewed: Allergy & Precautions, NPO status , Patient's Chart, lab work & pertinent test results  Airway Mallampati: II  TM Distance: <3 FB     Dental  (+) Teeth Intact   Pulmonary neg pulmonary ROS, former smoker,    Pulmonary exam normal        Cardiovascular negative cardio ROS Normal cardiovascular exam     Neuro/Psych PSYCHIATRIC DISORDERS Anxiety Depression    GI/Hepatic negative GI ROS, Neg liver ROS,   Endo/Other  negative endocrine ROS  Renal/GU negative Renal ROS  negative genitourinary   Musculoskeletal negative musculoskeletal ROS (+)   Abdominal Normal abdominal exam  (+)   Peds negative pediatric ROS (+)  Hematology   Anesthesia Other Findings Past Medical History: No date: Depression No date: Seasonal mood disorder (HCC) No date: Thyroid goiter  Reproductive/Obstetrics                            Anesthesia Physical Anesthesia Plan  ASA: II  Anesthesia Plan: General   Post-op Pain Management:    Induction: Intravenous  PONV Risk Score and Plan:   Airway Management Planned: Nasal Cannula  Additional Equipment:   Intra-op Plan:   Post-operative Plan:   Informed Consent: I have reviewed the patients History and Physical, chart, labs and discussed the procedure including the risks, benefits and alternatives for the proposed anesthesia with the patient or authorized representative who has indicated his/her understanding and acceptance.     Dental advisory given  Plan Discussed with: CRNA and Surgeon  Anesthesia Plan Comments:         Anesthesia Quick Evaluation

## 2021-02-08 NOTE — Plan of Care (Signed)
°  Problem: Education: °Goal: Knowledge of General Education information will improve °Description: Including pain rating scale, medication(s)/side effects and non-pharmacologic comfort measures °Outcome: Adequate for Discharge °  °Problem: Clinical Measurements: °Goal: Ability to maintain clinical measurements within normal limits will improve °Outcome: Adequate for Discharge °Goal: Will remain free from infection °Outcome: Adequate for Discharge °Goal: Diagnostic test results will improve °Outcome: Adequate for Discharge °Goal: Respiratory complications will improve °Outcome: Adequate for Discharge °Goal: Cardiovascular complication will be avoided °Outcome: Adequate for Discharge °  °

## 2021-02-08 NOTE — Discharge Instructions (Signed)

## 2021-02-08 NOTE — Op Note (Signed)
[facility] Patient Name: Rachel Arnold Procedure Date: 02/08/2021 1:04 PM MRN: [id number] Account #: [id number] Date of Birth: 24-Sep-1961 Admit Type: Outpatient Age: 60 Room: [facility] ENDO ROOM 4 Gender: Female Note Status: Finalized Procedure:             Upper GI endoscopy Indications:           Epigastric abdominal pain, Acute post hemorrhagic                         anemia, Coffee-ground emesis, Melena Providers:             Toney Reil MD, MD Referring MD:          Mliss Sax (Referring MD) Medicines:             General Anesthesia Complications:         No immediate complications. Estimated blood loss: None. Procedure:             Pre-Anesthesia Assessment:                        - Prior to the procedure, a History and Physical was                         performed, and patient medications and allergies were                         reviewed. The patient is competent. The risks and                         benefits of the procedure and the sedation options and                         risks were discussed with the patient. All questions                         were answered and informed consent was obtained.                         Patient identification and proposed procedure were                         verified by the physician, the nurse, the                         anesthesiologist, the anesthetist and the technician                         in the pre-procedure area in the procedure room in the                         endoscopy suite. Mental Status Examination: alert and                         oriented. Airway Examination: normal oropharyngeal                         airway and neck mobility. Respiratory Examination:  clear to auscultation. CV Examination: normal.                         Prophylactic Antibiotics: The patient does not require                         prophylactic antibiotics. Prior  Anticoagulants: The                         patient has taken no previous anticoagulant or                         antiplatelet agents. ASA Grade Assessment: II - A                         patient with mild systemic disease. After reviewing                         the risks and benefits, the patient was deemed in                         satisfactory condition to undergo the procedure. The                         anesthesia plan was to use general anesthesia.                         Immediately prior to administration of medications,                         the patient was re-assessed for adequacy to receive                         sedatives. The heart rate, respiratory rate, oxygen                         saturations, blood pressure, adequacy of pulmonary                         ventilation, and response to care were monitored                         throughout the procedure. The physical status of the                         patient was re-assessed after the procedure.                        After obtaining informed consent, the endoscope was                         passed under direct vision. Throughout the procedure,                         the patient's blood pressure, pulse, and oxygen                         saturations were monitored continuously. The Endoscope  was introduced through the mouth, and advanced to the                         second part of duodenum. The upper GI endoscopy was                         accomplished without difficulty. The patient tolerated                         the procedure well. Findings:      Diffuse moderate inflammation characterized by congestion (edema),       erythema and friability was found in the duodenal bulb.      The second portion of the duodenum was normal.      Two non-bleeding cratered gastric ulcers with a clean ulcer base       (Forrest Class III) were found at the incisura and in the prepyloric       region  of the stomach. The largest lesion was 10 mm in largest dimension.      Diffuse moderately erythematous mucosa without bleeding was found in the       gastric antrum. Biopsies were taken with a cold forceps for Helicobacter       pylori testing.      The gastric body was normal. Biopsies were taken with a cold forceps for       Helicobacter pylori testing.      The cardia and gastric fundus were normal on retroflexion.      A small hiatal hernia was present.      The gastroesophageal junction and examined esophagus were normal. Impression:            - Duodenitis.                        - Normal second portion of the duodenum.                        - Non-bleeding gastric ulcers with a clean ulcer base                         (Forrest Class III).                        - Erythematous mucosa in the antrum. Biopsied.                        - Normal gastric body. Biopsied.                        - Small hiatal hernia.                        - Normal gastroesophageal junction and esophagus. Recommendation:        - Discharge patient to home (with escort).                        - Resume previous diet today.                        - Continue present medications.                        -  Use Prilosec (omeprazole) 40 mg PO BID for 3 months.                        - Repeat upper endoscopy in 3 months to check healing.                        - Return to GI office in 2 months. Procedure Code(s):     --- Professional ---                        [phone], Esophagogastroduodenoscopy, flexible,                         transoral; with biopsy, single or multiple Diagnosis Code(s):     --- Professional ---                        K29.80, Duodenitis without bleeding                        K25.9, Gastric ulcer, unspecified as acute or chronic,                         without hemorrhage or perforation                        K31.89, Other diseases of stomach and duodenum                        K44.9,  Diaphragmatic hernia without obstruction or                         gangrene                        R10.13, Epigastric pain                        D62, Acute posthemorrhagic anemia                        K92.0, Hematemesis                        K92.1, Melena (includes Hematochezia) CPT copyright 2019 American Medical Association. All rights reserved. The codes documented in this report are preliminary and upon coder review may  be revised to meet current compliance requirements. Dr. Libby Maw Toney Reil MD, MD 02/08/2021 1:20:23 PM This report has been signed electronically. Number of Addenda: 0 Note Initiated On: 02/08/2021 1:04 PM Estimated Blood Loss:  Estimated blood loss: none.      [place]

## 2021-02-08 NOTE — Consult Note (Addendum)
Cephas Darby, MD [address]  [place]  [place],  [address]  Main: [phone]  Fax: [phone] Pager: [phone]   Consultation  Referring Provider:     No ref. provider found Primary Care Physician:  Libby Maw, MD Primary Gastroenterologist: Althia Forts         Reason for Consultation:     Melena, coffee-ground emesis  Date of Admission:  02/07/2021 Date of Consultation:  02/08/2021         HPI:   Rachel Arnold is a 60 y.o. female with 1 day history of black tarry stool as well as coffee-ground emesis associated with nausea.  Patient also had 2 episodes of syncope.  Patient reports that for last 3 weeks or so, she has been experiencing upper abdominal pain, abdominal bloating, nausea.  She said since fall last year, she had few days of severe diarrhea, had an ultrasound by her PCP, was to normal.  Her diarrhea resolved, however her upper GI symptoms continued.  Patient was hemodynamically stable on admission.  Labs revealed elevated BUN, normal creatinine, 28/0.77, LFTs normal, hemoglobin 10, dropped from 13 since January.  Normal platelets.  No evidence of chronic liver disease.  Patient denies smoking, alcohol use, NSAID use.  She is started on pantoprazole drip, GI is consulted for further evaluation.  Patient is n.p.o.  NSAIDs: None  Antiplts/Anticoagulants/Anti thrombotics: None  GI Procedures: Colonoscopy at age 77 for screening, reportedly normal She denies family history of GI malignancy  Past Medical History:  Diagnosis Date  . Depression   . Seasonal mood disorder ([place])   . Thyroid goiter     Past Surgical History:  Procedure Laterality Date  . FACIAL COSMETIC SURGERY  12/2020  . RHINOPLASTY  12/2020  . stress myoview  12/22/2008   EF 69%; LV norm.    Prior to Admission medications   Medication Sig Start Date End Date Taking? Authorizing Provider  escitalopram (LEXAPRO) 20 MG tablet Take 1 tablet (20 mg total) by mouth  daily. Patient taking differently: Take 20 mg by mouth at bedtime. 12/27/20  Yes Cirigliano, Mary K, DO  Multiple Vitamins-Minerals (HAIR SKIN AND NAILS FORMULA) TABS Take 1 tablet by mouth daily.   Yes [provider]  saccharomyces boulardii (FLORASTOR) 250 MG capsule Take 250 mg by mouth daily.   Yes [provider]  simethicone (PHAZYME) 125 MG chewable tablet Chew 125 mg by mouth every 6 (six) hours as needed for flatulence.   Yes [provider]   Current Facility-Administered Medications:  .  0.9 %  sodium chloride infusion, , Intravenous, Continuous, Mable Lashley, Tally Due, MD .  0.9 %  sodium chloride infusion, , Intravenous, Continuous, Zniya Cottone, Tally Due, MD, Last Rate: 20 mL/hr at 02/08/21 1248, New Bag at 02/08/21 1248 .  [MAR Hold] acetaminophen (TYLENOL) tablet 325 mg, 325 mg, Oral, Q6H PRN **OR** [MAR Hold] acetaminophen (TYLENOL) suppository 325 mg, 325 mg, Rectal, Q6H PRN, Cox, Amy N, DO .  [MAR Hold] escitalopram (LEXAPRO) tablet 20 mg, 20 mg, Oral, QHS, Cox, Amy N, DO, 20 mg at 02/07/21 2149 .  [MAR Hold] ondansetron (ZOFRAN) tablet 4 mg, 4 mg, Oral, Q6H PRN **OR** [MAR Hold] ondansetron (ZOFRAN) injection 4 mg, 4 mg, Intravenous, Q6H PRN, Cox, Amy N, DO .  pantoprazole (PROTONIX) 80 mg in sodium chloride 0.9 % 100 mL (0.8 mg/mL) infusion, 8 mg/hr, Intravenous, Continuous, Cox, Amy N, DO, Last Rate: 10 mL/hr at 02/08/21 1218, 8 mg/hr at  02/08/21 1218 .  [MAR Hold] pantoprazole (PROTONIX) injection 40 mg, 40 mg, Intravenous, Q12H, Cox, Amy N, DO  Facility-Administered Medications Ordered in Other Encounters:  .  lidocaine (cardiac) 100 mg/25m (XYLOCAINE) injection 2%, , Intravenous, Anesthesia Intra-op, BTollie Eth CRNA, 50 mg at 02/08/21 1308   Family History  Problem Relation Age of Onset  . Congestive Heart Failure Mother   . Coronary artery disease Mother   . Diabetes Mother   . Hypertension Mother   . Cancer Father   . Cancer Brother         Metastatic  . Coronary artery disease Maternal Grandmother   . Diabetes Maternal Grandmother   . Hypertension Maternal Grandmother   . Early death Brother   . Drug abuse Brother      Social History   Tobacco Use  . Smoking status: Former Smoker    Packs/day: 0.00    Years: 20.00    Pack years: 0.00    Types: Cigarettes    Quit date: 08/08/2020    Years since quitting: 0.5  . Smokeless tobacco: Never Used  . Tobacco comment: chantix prescribed  04/12/2012  Vaping Use  . Vaping Use: Never used  Substance Use Topics  . Alcohol use: Never    Comment: Occas  . Drug use: Never    Allergies as of 02/07/2021 - Review Complete 02/07/2021  Allergen Reaction Noted  . Penicillins  02/28/2013    Review of Systems:    All systems reviewed and negative except where noted in HPI.   Physical Exam:  Vital signs in last 24 hours: Temp:  [97.5 F (36.4 C)-98.3 F (36.8 C)] 97.5 F (36.4 C) (02/25 1244) Pulse Rate:  [59-85] 62 (02/25 1244) Resp:  [15-19] 18 (02/25 1244) BP: (88-118)/(65-82) 118/82 (02/25 1244) SpO2:  [95 %-100 %] 99 % (02/25 1244) Weight:  [79.4 kg] 79.4 kg (02/24 1542) Last BM Date: 02/07/21 General:   Pleasant, cooperative in NAD Head:  Normocephalic and atraumatic. Eyes:   No icterus.   Conjunctiva pink. PERRLA. Ears:  Normal auditory acuity. Neck:  Supple; no masses or thyroidomegaly Lungs: Respirations even and unlabored. Lungs clear to auscultation bilaterally.   No wheezes, crackles, or rhonchi.  Heart:  Regular rate and rhythm;  Without murmur, clicks, rubs or gallops Abdomen:  Soft, nondistended, nontender. Normal bowel sounds. No appreciable masses or hepatomegaly.  No rebound or guarding.  Rectal:  Not performed. Msk:  Symmetrical without gross deformities.  Strength normal Extremities:  Without edema, cyanosis or clubbing. Neurologic:  Alert and oriented x3;  grossly normal neurologically. Skin:  Intact without significant lesions or  rashes. Psych:  Alert and cooperative. Normal affect.  LAB RESULTS: CBC Latest Ref Rng & Units 02/08/2021 02/07/2021 02/07/2021  WBC 4.0 - 10.5 K/uL 9.7 - 14.8(H)  Hemoglobin 12.0 - 15.0 g/dL 10.0(L) 9.8(L) 10.0(L)  Hematocrit 36.0 - 46.0 % 29.0(L) 29.9(L) 30.8(L)  Platelets 150 - 400 K/uL 197 - 267    BMET BMP Latest Ref Rng & Units 02/08/2021 02/07/2021 12/27/2020  Glucose 70 - 99 mg/dL 92 127(H) 92  BUN 6 - 20 mg/dL 28(H) 28(H) 12  Creatinine 0.44 - 1.00 mg/dL 0.77 0.99 0.95  Sodium 135 - 145 mmol/L 142 142 142  Potassium 3.5 - 5.1 mmol/L 3.8 4.3 3.9  Chloride 98 - 111 mmol/L 112(H) 108 107  CO2 22 - 32 mmol/L '25 26 28  ' Calcium 8.9 - 10.3 mg/dL 8.4(L) 8.6(L) 9.3    LFT Hepatic Function Latest Ref  Rng & Units 02/07/2021 12/27/2020 09/11/2020  Total Protein 6.5 - 8.1 g/dL 5.6(L) 6.1 6.2  Albumin 3.5 - 5.0 g/dL 3.4(L) 4.4 4.3  AST 15 - 41 U/L '18 16 10  ' ALT 0 - 44 U/L '21 16 17  ' Alk Phosphatase 38 - 126 U/L 51 65 56  Total Bilirubin 0.3 - 1.2 mg/dL 0.6 0.4 0.5     STUDIES: CT HEAD WO CONTRAST  Result Date: 02/08/2021 CLINICAL DATA:  Syncopal episode.  Nausea and vomiting. EXAM: CT HEAD WITHOUT CONTRAST TECHNIQUE: Contiguous axial images were obtained from the base of the skull through the vertex without intravenous contrast. COMPARISON:  None. FINDINGS: Brain: The brain shows a normal appearance without evidence of malformation, atrophy, old or acute small or large vessel infarction, mass lesion, hemorrhage, hydrocephalus or extra-axial collection. Vascular: No hyperdense vessel. No evidence of atherosclerotic calcification. Skull: Normal.  No traumatic finding.  No focal bone lesion. Sinuses/Orbits: Sinuses are clear. Orbits appear normal. Mastoids are clear. Other: None significant IMPRESSION: Normal head CT. Electronically Signed   By: Nelson Chimes M.D.   On: 02/08/2021 00:03      Impression / Plan:   Rachel Arnold is a 60 y.o. female with several months history of symptoms of  dyspepsia presented with 1 day history of melena, nausea, coffee-ground emesis  Continue pantoprazole drip Maintain 2 large-bore IVs Monitor CBC closely to maintain hemoglobin above 7 Recommend upper endoscopy today, if negative, recommend colonoscopy for further evaluation Check iron panel, B12 and folate panel  I have discussed alternative options, risks & benefits,  which include, but are not limited to, bleeding, infection, perforation,respiratory complication & drug reaction.  The patient agrees with this plan & written consent will be obtained.     Thank you for involving me in the care of this patient.      LOS: 0 days   Sherri Sear, MD  02/08/2021, 1:09 PM   Note: This dictation was prepared with Dragon dictation along with smaller phrase technology. Any transcriptional errors that result from this process are unintentional.

## 2021-02-10 NOTE — Anesthesia Postprocedure Evaluation (Signed)
Anesthesia Post Note  Patient: RAFFAELLA EDISON  Procedure(s) Performed: ESOPHAGOGASTRODUODENOSCOPY (EGD) WITH PROPOFOL (N/A )  Patient location during evaluation: Endoscopy Anesthesia Type: General Level of consciousness: awake and alert and oriented Pain management: pain level controlled Vital Signs Assessment: post-procedure vital signs reviewed and stable Respiratory status: spontaneous breathing Cardiovascular status: blood pressure returned to baseline Anesthetic complications: no   No complications documented.   Last Vitals:  Vitals:   02/08/21 1342 02/08/21 1351  BP: 118/69 108/72  Pulse: 65 70  Resp: (!) 21 15  Temp:    SpO2: 98% 98%    Last Pain:  Vitals:   02/08/21 1351  TempSrc:   PainSc: 0-No pain                 CARROLL,PAUL

## 2021-02-11 ENCOUNTER — Telehealth: Payer: Self-pay

## 2021-02-11 ENCOUNTER — Other Ambulatory Visit: Payer: Self-pay | Admitting: Gastroenterology

## 2021-02-11 ENCOUNTER — Other Ambulatory Visit: Payer: Self-pay

## 2021-02-11 ENCOUNTER — Encounter: Payer: Self-pay | Admitting: Gastroenterology

## 2021-02-11 DIAGNOSIS — A048 Other specified bacterial intestinal infections: Secondary | ICD-10-CM

## 2021-02-11 DIAGNOSIS — K279 Peptic ulcer, site unspecified, unspecified as acute or chronic, without hemorrhage or perforation: Secondary | ICD-10-CM

## 2021-02-11 LAB — SURGICAL PATHOLOGY

## 2021-02-11 MED ORDER — METRONIDAZOLE 500 MG PO TABS: 500.0000 mg | ORAL_TABLET | Freq: Two times a day (BID) | ORAL | 0 refills | Status: DC

## 2021-02-11 MED ORDER — METRONIDAZOLE 500 MG PO TABS: 500.0000 mg | ORAL_TABLET | Freq: Three times a day (TID) | ORAL | 0 refills | Status: AC

## 2021-02-11 MED ORDER — CLARITHROMYCIN 500 MG PO TABS: 500.0000 mg | ORAL_TABLET | Freq: Two times a day (BID) | ORAL | 0 refills | Status: AC

## 2021-02-11 NOTE — Telephone Encounter (Signed)
Called and left a message for call back  

## 2021-02-11 NOTE — Telephone Encounter (Signed)
Patient verablized understanding of results. Patient will go pick up medications. She scheduled EGD on 04/24/2021 sent instructions to [facility]

## 2021-02-11 NOTE — Telephone Encounter (Signed)
Transition Care Management Follow-up Telephone Call  Date of discharge and from where: 02/08/2021-[facility]  How have you been since you were released from the hospital?" Fine"  Any questions or concerns? No  Items Reviewed:  Did the pt receive and understand the discharge instructions provided? Yes   Medications obtained and verified? Yes   Other? Yes   Any new allergies since your discharge? No   Dietary orders reviewed? Yes  Do you have support at home? Yes   Home Care and Equipment/Supplies: Were home health services ordered? no If so, what is the name of the agency? n/a  Has the agency set up a time to come to the patient's home? not applicable Were any new equipment or medical supplies ordered?  No What is the name of the medical supply agency? n/a Were you able to get the supplies/equipment? not applicable Do you have any questions related to the use of the equipment or supplies? n/a  Functional Questionnaire: (I = Independent and D = Dependent) ADLs: I  Bathing/Dressing- I  Meal Prep- I  Eating- I  Maintaining continence- I  Transferring/Ambulation- I  Managing Meds- I  Follow up appointments reviewed:   PCP Hospital f/u appt confirmed? Yes  Scheduled to see Dr. Barron Alvine on 02/14/2021 @ 11:00.  Specialist Hospital f/u appt confirmed? No  GI to contact patient  Are transportation arrangements needed? No   If their condition worsens, is the pt aware to call PCP or go to the Emergency Dept.? Yes  Was the patient provided with contact information for the PCP's office or ED? Yes  Was to pt encouraged to call back with questions or concerns? Yes

## 2021-02-11 NOTE — Telephone Encounter (Signed)
Sent medication to the pharmacy. Tried to call patient but line was busy

## 2021-02-11 NOTE — Telephone Encounter (Signed)
-----   Message from Toney Reil, MD sent at 02/11/2021 10:48 AM EST ----- Plz send her the prescription for triple therapy to treat H Pylori for 14days  Clarithromycin 500mg  BID Flagyl 500mg  TID  She is already on prilosec 40mg  BID  Repeat EGD in 86months to check for H Pylori eradication and confirm healing of ulcers  Thanks RV

## 2021-02-14 ENCOUNTER — Other Ambulatory Visit: Payer: Self-pay

## 2021-02-14 ENCOUNTER — Encounter: Payer: Self-pay | Admitting: Family Medicine

## 2021-02-14 ENCOUNTER — Ambulatory Visit (INDEPENDENT_AMBULATORY_CARE_PROVIDER_SITE_OTHER): Payer: 59 | Admitting: Family Medicine

## 2021-02-14 VITALS — BP 98/64 | HR 68 | Temp 97.9°F | Ht 67.0 in | Wt 185.4 lb

## 2021-02-14 DIAGNOSIS — A048 Other specified bacterial intestinal infections: Secondary | ICD-10-CM | POA: Diagnosis not present

## 2021-02-14 DIAGNOSIS — K269 Duodenal ulcer, unspecified as acute or chronic, without hemorrhage or perforation: Secondary | ICD-10-CM | POA: Diagnosis not present

## 2021-02-14 DIAGNOSIS — K922 Gastrointestinal hemorrhage, unspecified: Secondary | ICD-10-CM | POA: Diagnosis not present

## 2021-02-14 DIAGNOSIS — Z09 Encounter for follow-up examination after completed treatment for conditions other than malignant neoplasm: Secondary | ICD-10-CM

## 2021-02-14 LAB — CBC
HCT: 27.9 % — ABNORMAL LOW (ref 36.0–46.0)
Hemoglobin: 9.7 g/dL — ABNORMAL LOW (ref 12.0–15.0)
MCHC: 34.8 g/dL (ref 30.0–36.0)
MCV: 85.8 fl (ref 78.0–100.0)
Platelets: 243 10*3/uL (ref 150.0–400.0)
RBC: 3.26 Mil/uL — ABNORMAL LOW (ref 3.87–5.11)
RDW: 14 % (ref 11.5–15.5)
WBC: 7.9 10*3/uL (ref 4.0–10.5)

## 2021-02-14 NOTE — Progress Notes (Signed)
Rachel Arnold is a 60 y.o. female      Chief Complaint  Patient presents with  . Hospitalization Follow-up    Pt DC Friday.  No complaints today    HPI: Rachel Arnold is a 60 y.o. female patient of Dr. Doreene Burke seen today for hospital f/u. She was admitted to [facility] on 02/07/21 after multiple episodes of coffee ground emesis and syncopal episode after having a BM.  CT heat - WNL She received 1 unit PRBC d/t Hgb of 10 (bseline 13 1 mo prior). EGD done (Dr. Allegra Lai) and showedinflammation in early duodenum and two clean-based, nonbleeding ulcers in stomach. Biopsy + for h pylori and she is being treated for this.  She was discharged on omeprazole 40mg  BID x 3 mo. Repeat EGD scheduled for 04/24/21. She needs CBC today.  Appetite is good.         Past Medical History:  Diagnosis Date  . Depression   . Seasonal mood disorder (HCC)   . Thyroid goiter          Past Surgical History:  Procedure Laterality Date  . ESOPHAGOGASTRODUODENOSCOPY (EGD) WITH PROPOFOL N/A 02/08/2021   Procedure: ESOPHAGOGASTRODUODENOSCOPY (EGD) WITH PROPOFOL;  Surgeon: 02/10/2021, MD;  Location: [facility] ENDOSCOPY;  Service: Gastroenterology;  Laterality: N/A;  . FACIAL COSMETIC SURGERY  12/2020  . RHINOPLASTY  12/2020  . stress myoview  12/22/2008   EF 69%; LV norm.    Social History        Socioeconomic History  . Marital status: Married    Spouse name: Not on file  . Number of children: Not on file  . Years of education: Not on file  . Highest education level: Not on file  Occupational History  . Not on file  Tobacco Use  . Smoking status: Former Smoker    Packs/day: 0.00    Years: 20.00    Pack years: 0.00    Types: Cigarettes    Quit date: 08/08/2020    Years since quitting: 0.5  . Smokeless tobacco: Never Used  . Tobacco comment: chantix prescribed  04/12/2012  Vaping Use  . Vaping Use: Never used  Substance and Sexual Activity  . Alcohol  use: Never    Comment: Occas  . Drug use: Never  . Sexual activity: Not Currently    Birth control/protection: Post-menopausal  Other Topics Concern  . Not on file  Social History Narrative  . Not on file   Social Determinants of Health   Financial Resource Strain: Not on file  Food Insecurity: Not on file  Transportation Needs: Not on file  Physical Activity: Not on file  Stress: Not on file  Social Connections: Not on file  Intimate Partner Violence: Not on file         Family History  Problem Relation Age of Onset  . Congestive Heart Failure Mother   . Coronary artery disease Mother   . Diabetes Mother   . Hypertension Mother   . Cancer Father   . Cancer Brother        Metastatic  . Coronary artery disease Maternal Grandmother   . Diabetes Maternal Grandmother   . Hypertension Maternal Grandmother   . Early death Brother   . Drug abuse Brother          Immunization History  Administered Date(s) Administered  . Influenza,inj,Quad PF,6+ Mos 09/05/2020  . PFIZER(Purple Top)SARS-COV-2 Vaccination 02/27/2020, 04/01/2020, 09/23/2020  . Pneumococcal Polysaccharide-23 06/13/2015  . Tdap 06/13/2015  Outpatient Encounter Medications as of 02/14/2021  Medication Sig  . clarithromycin (BIAXIN) 500 MG tablet Take 1 tablet (500 mg total) by mouth 2 (two) times daily for 14 days.  Marland Kitchen escitalopram (LEXAPRO) 20 MG tablet Take 1 tablet (20 mg total) by mouth daily. (Patient taking differently: Take 20 mg by mouth at bedtime.)  . Iron-FA-B Cmp-C-Biot-Probiotic (FUSION PLUS) CAPS Take 1 capsule by mouth daily.  . metroNIDAZOLE (FLAGYL) 500 MG tablet Take 1 tablet (500 mg total) by mouth 3 (three) times daily for 14 days.  . Multiple Vitamins-Minerals (HAIR SKIN AND NAILS FORMULA) TABS Take 1 tablet by mouth daily.  Marland Kitchen omeprazole (PRILOSEC) 40 MG capsule Take 1 capsule (40 mg total) by mouth in the morning and at bedtime.  . saccharomyces boulardii  (FLORASTOR) 250 MG capsule Take 250 mg by mouth daily.  . simethicone (MYLICON) 125 MG chewable tablet Chew 125 mg by mouth every 6 (six) hours as needed for flatulence. (Patient not taking: Reported on 02/14/2021)   No facility-administered encounter medications on file as of 02/14/2021.     ROS: Pertinent positives and negatives noted in HPI. Remainder of ROS non-contributory       Allergies  Allergen Reactions  . Penicillins     BP 98/64 (BP Location: Left Arm, Patient Position: Sitting, Cuff Size: Normal)   Pulse 68   Temp 97.9 F (36.6 C) (Temporal)   Ht 5\' 7"  (1.702 m)   Wt 185 lb 6.4 oz (84.1 kg)   SpO2 97%   BMI 29.04 kg/m   Wt Readings from Last 3 Encounters:  02/14/21 185 lb 6.4 oz (84.1 kg)  02/07/21 175 lb (79.4 kg)  12/27/20 178 lb (80.7 kg)   Temp Readings from Last 3 Encounters:  02/14/21 97.9 F (36.6 C) (Temporal)  02/08/21 (!) 97.5 F (36.4 C) (Temporal)  12/27/20 (!) 97.4 F (36.3 C) (Temporal)   BP Readings from Last 3 Encounters:  02/14/21 98/64  02/08/21 108/72  12/27/20 112/78   Pulse Readings from Last 3 Encounters:  02/14/21 68  02/08/21 70  12/27/20 64    Physical Exam Constitutional:      General: She is not in acute distress.    Appearance: Normal appearance. She is not ill-appearing.  Abdominal:     General: Abdomen is flat. There is no distension.     Palpations: Abdomen is soft.     Tenderness: There is no abdominal tenderness.  Neurological:     Mental Status: She is alert and oriented to person, place, and time.  Psychiatric:        Mood and Affect: Mood normal.        Behavior: Behavior normal.     A/P:  1. Hospital discharge follow-up 2. Upper GI bleed 3. Duodenal ulcer 4. H. pylori infection - symptoms resolved - currently being treated for h pyolri and then to take omeprazole 40mg  BID - has repeat EGD scheduled in 04/2021 - CBC   This visit occurred during the SARS-CoV-2 public health emergency.   Safety protocols were in place, including screening questions prior to the visit, additional usage of staff PPE, and extensive cleaning of exam room while observing appropriate contact time as indicated for disinfecting solutions.

## 2021-02-15 ENCOUNTER — Encounter: Payer: Self-pay | Admitting: Family Medicine

## 2021-02-15 ENCOUNTER — Inpatient Hospital Stay: Payer: 59 | Admitting: Nurse Practitioner

## 2021-02-15 ENCOUNTER — Other Ambulatory Visit: Payer: Self-pay | Admitting: Family Medicine

## 2021-02-15 DIAGNOSIS — K922 Gastrointestinal hemorrhage, unspecified: Secondary | ICD-10-CM

## 2021-02-15 DIAGNOSIS — K269 Duodenal ulcer, unspecified as acute or chronic, without hemorrhage or perforation: Secondary | ICD-10-CM

## 2021-02-22 NOTE — Telephone Encounter (Signed)
Called patient to let her know that TOC has been approved by both providers. Okay to schedule with Dr. Salena Saner.

## 2021-04-11 ENCOUNTER — Telehealth: Payer: Self-pay | Admitting: Family Medicine

## 2021-04-11 NOTE — Telephone Encounter (Signed)
Pt is wondering if she is needing a f/u endoscopic procedure, if this is necessary. She states you two have spoken about this before.  Please advise pt at  [phone].

## 2021-04-16 NOTE — Telephone Encounter (Signed)
I called and left pt a VM informing her that on her last visit w/ Dr. Salena Saner on 02/14/21, Dr. Salena Saner noted that pt had a Repeat EGD scheduled for 04/24/21.  I asked pt to call office is she is referring to something different.

## 2021-04-24 ENCOUNTER — Encounter
Admission: RE | Disposition: A | Discharge status: Home / Self Care | Payer: Self-pay | Source: Home / Self Care | Attending: Gastroenterology

## 2021-04-24 ENCOUNTER — Ambulatory Visit: Payer: 59 | Admitting: Anesthesiology

## 2021-04-24 ENCOUNTER — Other Ambulatory Visit
Admission: RE | Admit: 2021-04-24 | Discharge: 2021-04-24 | Disposition: A | Discharge status: Home / Self Care | Payer: 59 | Source: Ambulatory Visit | Attending: Gastroenterology | Admitting: Gastroenterology

## 2021-04-24 ENCOUNTER — Ambulatory Visit
Admission: RE | Admit: 2021-04-24 | Discharge: 2021-04-24 | Disposition: A | Discharge status: Home / Self Care | Payer: 59 | Attending: Gastroenterology | Admitting: Gastroenterology

## 2021-04-24 ENCOUNTER — Other Ambulatory Visit: Payer: Self-pay | Admitting: Gastroenterology

## 2021-04-24 ENCOUNTER — Encounter: Payer: Self-pay | Admitting: Gastroenterology

## 2021-04-24 ENCOUNTER — Other Ambulatory Visit: Payer: Self-pay

## 2021-04-24 DIAGNOSIS — A048 Other specified bacterial intestinal infections: Secondary | ICD-10-CM | POA: Insufficient documentation

## 2021-04-24 DIAGNOSIS — K279 Peptic ulcer, site unspecified, unspecified as acute or chronic, without hemorrhage or perforation: Secondary | ICD-10-CM

## 2021-04-24 DIAGNOSIS — K2950 Unspecified chronic gastritis without bleeding: Secondary | ICD-10-CM | POA: Insufficient documentation

## 2021-04-24 DIAGNOSIS — Z809 Family history of malignant neoplasm, unspecified: Secondary | ICD-10-CM | POA: Insufficient documentation

## 2021-04-24 DIAGNOSIS — Z8249 Family history of ischemic heart disease and other diseases of the circulatory system: Secondary | ICD-10-CM | POA: Diagnosis not present

## 2021-04-24 DIAGNOSIS — Z87891 Personal history of nicotine dependence: Secondary | ICD-10-CM | POA: Diagnosis not present

## 2021-04-24 DIAGNOSIS — D500 Iron deficiency anemia secondary to blood loss (chronic): Secondary | ICD-10-CM | POA: Insufficient documentation

## 2021-04-24 DIAGNOSIS — Z833 Family history of diabetes mellitus: Secondary | ICD-10-CM | POA: Diagnosis not present

## 2021-04-24 DIAGNOSIS — B9681 Helicobacter pylori [H. pylori] as the cause of diseases classified elsewhere: Secondary | ICD-10-CM | POA: Diagnosis not present

## 2021-04-24 DIAGNOSIS — Z880 Allergy status to penicillin: Secondary | ICD-10-CM | POA: Diagnosis not present

## 2021-04-24 DIAGNOSIS — Z8711 Personal history of peptic ulcer disease: Secondary | ICD-10-CM | POA: Diagnosis present

## 2021-04-24 HISTORY — PX: ESOPHAGOGASTRODUODENOSCOPY (EGD) WITH PROPOFOL: SHX5813

## 2021-04-24 LAB — CBC
HCT: 39.2 % (ref 36.0–46.0)
Hemoglobin: 12.8 g/dL (ref 12.0–15.0)
MCH: 27.7 pg (ref 26.0–34.0)
MCHC: 32.7 g/dL (ref 30.0–36.0)
MCV: 84.8 fL (ref 80.0–100.0)
Platelets: 251 10*3/uL (ref 150–400)
RBC: 4.62 MIL/uL (ref 3.87–5.11)
RDW: 13.3 % (ref 11.5–15.5)
WBC: 7.1 10*3/uL (ref 4.0–10.5)
nRBC: 0 % (ref 0.0–0.2)

## 2021-04-24 LAB — VITAMIN B12: Vitamin B-12: 1135 pg/mL — ABNORMAL HIGH (ref 180–914)

## 2021-04-24 LAB — IRON AND TIBC
Iron: 42 ug/dL (ref 28–170)
Saturation Ratios: 12 % (ref 10.4–31.8)
TIBC: 356 ug/dL (ref 250–450)
UIBC: 314 ug/dL

## 2021-04-24 LAB — FERRITIN: Ferritin: 33 ng/mL (ref 11–307)

## 2021-04-24 LAB — FOLATE: Folate: 33 ng/mL (ref >5.9)

## 2021-04-24 SURGERY — ESOPHAGOGASTRODUODENOSCOPY (EGD) WITH PROPOFOL
Anesthesia: General

## 2021-04-24 MED ORDER — PROPOFOL 10 MG/ML IV BOLUS
INTRAVENOUS | Status: DC | PRN
  Administered 2021-04-24: 20 mg via INTRAVENOUS
  Administered 2021-04-24: 80 mg via INTRAVENOUS

## 2021-04-24 MED ORDER — PROPOFOL 500 MG/50ML IV EMUL
INTRAVENOUS | Status: DC | PRN
  Administered 2021-04-24: 175 ug/kg/min via INTRAVENOUS

## 2021-04-24 MED ORDER — PHENYLEPHRINE HCL (PRESSORS) 10 MG/ML IV SOLN
INTRAVENOUS | Status: AC
  Filled 2021-04-24: qty 1

## 2021-04-24 MED ORDER — SEVOFLURANE IN SOLN
RESPIRATORY_TRACT | Status: AC
  Filled 2021-04-24: qty 250

## 2021-04-24 MED ORDER — LIDOCAINE HCL (CARDIAC) PF 100 MG/5ML IV SOSY
PREFILLED_SYRINGE | INTRAVENOUS | Status: DC | PRN
  Administered 2021-04-24: 40 mg via INTRAVENOUS

## 2021-04-24 MED ORDER — PROPOFOL 500 MG/50ML IV EMUL
INTRAVENOUS | Status: AC
  Filled 2021-04-24: qty 50

## 2021-04-24 MED ORDER — SODIUM CHLORIDE 0.9 % IV SOLN
INTRAVENOUS | Status: DC
  Administered 2021-04-24: 1000 mL via INTRAVENOUS

## 2021-04-24 MED ORDER — LIDOCAINE HCL (PF) 2 % IJ SOLN
INTRAMUSCULAR | Status: AC
  Filled 2021-04-24: qty 2

## 2021-04-24 NOTE — H&P (Signed)
Arlyss Repress, MD [address]  [address]  [place], [place] [address]  Main: [phone]  Fax: [phone] Pager: [phone]  Primary Care Physician:  Overton Mam, DO Primary Gastroenterologist:  Dr. Arlyss Repress  Pre-Procedure History & Physical: HPI:  Rachel Arnold is a 60 y.o. female is here for an endoscopy.   Past Medical History:  Diagnosis Date  . Depression   . Seasonal mood disorder (HCC)   . Thyroid goiter     Past Surgical History:  Procedure Laterality Date  . ESOPHAGOGASTRODUODENOSCOPY (EGD) WITH PROPOFOL N/A 02/08/2021   Procedure: ESOPHAGOGASTRODUODENOSCOPY (EGD) WITH PROPOFOL;  Surgeon: Toney Reil, MD;  Location: [facility] ENDOSCOPY;  Service: Gastroenterology;  Laterality: N/A;  . FACIAL COSMETIC SURGERY  12/2020  . RHINOPLASTY  12/2020  . stress myoview  12/22/2008   EF 69%; LV norm.    Prior to Admission medications   Medication Sig Start Date End Date Taking? Authorizing Provider  escitalopram (LEXAPRO) 20 MG tablet Take 1 tablet (20 mg total) by mouth daily. Patient taking differently: Take 20 mg by mouth at bedtime. 12/27/20  Yes Cirigliano, Mary K, DO  Multiple Vitamins-Minerals (HAIR SKIN AND NAILS FORMULA) TABS Take 1 tablet by mouth daily.   Yes [provider]  omeprazole (PRILOSEC) 40 MG capsule Take 1 capsule (40 mg total) by mouth in the morning and at bedtime. 02/08/21  Yes Arnetha Courser, MD  saccharomyces boulardii (FLORASTOR) 250 MG capsule Take 250 mg by mouth daily.   Yes [provider]  simethicone (MYLICON) 125 MG chewable tablet Chew 125 mg by mouth every 6 (six) hours as needed for flatulence. Patient not taking: Reported on 02/14/2021    [provider]    Allergies as of 02/11/2021 - Review Complete 02/08/2021  Allergen Reaction Noted  . Penicillins  02/28/2013    Family History  Problem Relation Age of Onset  . Congestive Heart Failure Mother   . Coronary artery disease Mother    . Diabetes Mother   . Hypertension Mother   . Cancer Father   . Cancer Brother        Metastatic  . Coronary artery disease Maternal Grandmother   . Diabetes Maternal Grandmother   . Hypertension Maternal Grandmother   . Early death Brother   . Drug abuse Brother     Social History   Socioeconomic History  . Marital status: Married    Spouse name: Not on file  . Number of children: Not on file  . Years of education: Not on file  . Highest education level: Not on file  Occupational History  . Not on file  Tobacco Use  . Smoking status: Former Smoker    Packs/day: 0.00    Years: 20.00    Pack years: 0.00    Types: Cigarettes    Quit date: 08/08/2020    Years since quitting: 0.7  . Smokeless tobacco: Never Used  . Tobacco comment: chantix prescribed  04/12/2012  Vaping Use  . Vaping Use: Never used  Substance and Sexual Activity  . Alcohol use: Never    Comment: Occas  . Drug use: Never  . Sexual activity: Not Currently    Birth control/protection: Post-menopausal  Other Topics Concern  . Not on file  Social History Narrative  . Not on file   Social Determinants of Health   Financial Resource Strain: Not on file  Food Insecurity: Not on file  Transportation Needs: Not on file  Physical Activity:  Not on file  Stress: Not on file  Social Connections: Not on file  Intimate Partner Violence: Not on file    Review of Systems: See HPI, otherwise negative ROS  Physical Exam: BP 124/84   Pulse (!) 57   Temp (!) 97.2 F (36.2 C) (Temporal)   Resp 16   Ht 5\' 7"  (1.702 m)   Wt 83.6 kg   SpO2 100%   BMI 28.88 kg/m  General:   Alert,  pleasant and cooperative in NAD Head:  Normocephalic and atraumatic. Neck:  Supple; no masses or thyromegaly. Lungs:  Clear throughout to auscultation.    Heart:  Regular rate and rhythm. Abdomen:  Soft, nontender and nondistended. Normal bowel sounds, without guarding, and without rebound.   Neurologic:  Alert and  oriented  x4;  grossly normal neurologically.  Impression/Plan: is here for an endoscopy to be performed for h/o PUD  Risks, benefits, limitations, and alternatives regarding  endoscopy have been reviewed with the patient.  Questions have been answered.  All parties agreeable.   [name], MD  04/24/2021, 8:07 AM

## 2021-04-24 NOTE — Anesthesia Postprocedure Evaluation (Signed)
Anesthesia Post Note  Patient: Rachel Arnold  Procedure(s) Performed: ESOPHAGOGASTRODUODENOSCOPY (EGD) WITH PROPOFOL (N/A )  Patient location during evaluation: Phase II Anesthesia Type: General Level of consciousness: awake and alert, awake and oriented Pain management: pain level controlled Vital Signs Assessment: post-procedure vital signs reviewed and stable Respiratory status: spontaneous breathing, nonlabored ventilation and respiratory function stable Cardiovascular status: blood pressure returned to baseline and stable Postop Assessment: no apparent nausea or vomiting Anesthetic complications: no   No complications documented.   Last Vitals:  Vitals:   04/24/21 0830 04/24/21 0850  BP: 133/75 140/90  Pulse:    Resp:    Temp:    SpO2:      Last Pain:  Vitals:   04/24/21 0850  TempSrc:   PainSc: 0-No pain                 Manfred Arch

## 2021-04-24 NOTE — Anesthesia Procedure Notes (Signed)
Date/Time: 04/24/2021 8:06 AM Performed by: Ginger Carne, CRNA Pre-anesthesia Checklist: Patient identified, Emergency Drugs available, Suction available, Patient being monitored and Timeout performed Patient Re-evaluated:Patient Re-evaluated prior to induction Oxygen Delivery Method: Nasal cannula Preoxygenation: Pre-oxygenation with 100% oxygen Induction Type: IV induction

## 2021-04-24 NOTE — Op Note (Signed)
[facility] Gastroenterology Patient Name: Rachel Arnold Procedure Date: 04/24/2021 7:24 AM MRN: [id number] Account #: [id number] Date of Birth: 03-Oct-1961 Admit Type: Outpatient Age: 60 Room: [facility] ENDO ROOM 4 Gender: Female Note Status: Finalized Procedure:             Upper GI endoscopy Indications:           Epigastric abdominal pain, Previously treated for                         Helicobacter pylori, Follow-up of acute gastric ulcer Providers:             Toney Reil MD, MD Referring MD:          Jearld Lesch. Cirigliano (Referring MD) Medicines:             General Anesthesia Complications:         No immediate complications. Estimated blood loss: None. Procedure:             Pre-Anesthesia Assessment:                        - Prior to the procedure, a History and Physical was                         performed, and patient medications and allergies were                         reviewed. The patient is competent. The risks and                         benefits of the procedure and the sedation options and                         risks were discussed with the patient. All questions                         were answered and informed consent was obtained.                         Patient identification and proposed procedure were                         verified by the physician, the nurse, the                         anesthesiologist, the anesthetist and the technician                         in the pre-procedure area in the procedure room in the                         endoscopy suite. Mental Status Examination: alert and                         oriented. Airway Examination: normal oropharyngeal                         airway and neck mobility. Respiratory Examination:  clear to auscultation. CV Examination: normal.                         Prophylactic Antibiotics: The patient does not require                         prophylactic  antibiotics. Prior Anticoagulants: The                         patient has taken no previous anticoagulant or                         antiplatelet agents. ASA Grade Assessment: II - A                         patient with mild systemic disease. After reviewing                         the risks and benefits, the patient was deemed in                         satisfactory condition to undergo the procedure. The                         anesthesia plan was to use general anesthesia.                         Immediately prior to administration of medications,                         the patient was re-assessed for adequacy to receive                         sedatives. The heart rate, respiratory rate, oxygen                         saturations, blood pressure, adequacy of pulmonary                         ventilation, and response to care were monitored                         throughout the procedure. The physical status of the                         patient was re-assessed after the procedure.                        After obtaining informed consent, the endoscope was                         passed under direct vision. Throughout the procedure,                         the patient's blood pressure, pulse, and oxygen                         saturations were monitored continuously. The Endoscope  was introduced through the mouth, and advanced to the                         second part of duodenum. The upper GI endoscopy was                         accomplished without difficulty. The patient tolerated                         the procedure well. Findings:      The duodenal bulb and second portion of the duodenum were normal.      Diffuse moderately erythematous mucosa without bleeding was found in the       gastric antrum.      The gastric body and incisura were normal, ulcer has healed. Biopsies       were taken with a cold forceps for Helicobacter pylori testing.        Estimated blood loss: none.      The cardia and gastric fundus were normal on retroflexion.      The gastroesophageal junction and examined esophagus were normal. Impression:            - Normal duodenal bulb and second portion of the                         duodenum.                        - Erythematous mucosa in the antrum.                        - Normal gastric body and incisura. Biopsied.                        - Normal gastroesophageal junction and esophagus. Recommendation:        - Discharge patient to home (with escort).                        - Resume regular diet.                        - Continue present medications.                        - Await pathology results. Procedure Code(s):     --- Professional ---                        [phone], Esophagogastroduodenoscopy, flexible,                         transoral; with biopsy, single or multiple Diagnosis Code(s):     --- Professional ---                        K31.89, Other diseases of stomach and duodenum                        R10.13, Epigastric pain                        K25.3, Acute gastric ulcer without hemorrhage or  perforation CPT copyright 2019 American Medical Association. All rights reserved. The codes documented in this report are preliminary and upon coder review may  be revised to meet current compliance requirements. Dr. Libby Maw Toney Reil MD, MD 04/24/2021 8:18:31 AM This report has been signed electronically. Number of Addenda: 0 Note Initiated On: 04/24/2021 7:24 AM Estimated Blood Loss:  Estimated blood loss: none.      [facility]

## 2021-04-24 NOTE — Transfer of Care (Signed)
Immediate Anesthesia Transfer of Care Note  Patient: Rachel Arnold  Procedure(s) Performed: ESOPHAGOGASTRODUODENOSCOPY (EGD) WITH PROPOFOL (N/A )  Patient Location: PACU  Anesthesia Type:General  Level of Consciousness: awake and drowsy  Airway & Oxygen Therapy: Patient Spontanous Breathing  Post-op Assessment: Report given to RN and Post -op Vital signs reviewed and stable  Post vital signs: Reviewed and stable  Last Vitals:  Vitals Value Taken Time  BP 127/80 04/24/21 0822  Temp    Pulse 65 04/24/21 0822  Resp 14 04/24/21 0822  SpO2 98 % 04/24/21 0822    Last Pain:  Vitals:   04/24/21 0713  TempSrc: Temporal  PainSc: 0-No pain         Complications: No complications documented.

## 2021-04-24 NOTE — Anesthesia Preprocedure Evaluation (Signed)
Anesthesia Evaluation  Patient identified by MRN, date of birth, ID band Patient awake    Reviewed: Allergy & Precautions, NPO status , Patient's Chart, lab work & pertinent test results  Airway Mallampati: II  TM Distance: <3 FB Neck ROM: Full    Dental  (+) Teeth Intact   Pulmonary neg pulmonary ROS, former smoker,    Pulmonary exam normal        Cardiovascular + CAD  Normal cardiovascular exam     Neuro/Psych PSYCHIATRIC DISORDERS Anxiety Depression    GI/Hepatic negative GI ROS, Neg liver ROS,   Endo/Other  negative endocrine ROS  Renal/GU negative Renal ROS  negative genitourinary   Musculoskeletal negative musculoskeletal ROS (+)   Abdominal Normal abdominal exam  (+)   Peds negative pediatric ROS (+)  Hematology   Anesthesia Other Findings GI Bleed Depression Seasonal Alleries  Reproductive/Obstetrics                           Anesthesia Physical  Anesthesia Plan  ASA: II  Anesthesia Plan: General   Post-op Pain Management:    Induction: Intravenous  PONV Risk Score and Plan: 2 and Propofol infusion and TIVA  Airway Management Planned: Nasal Cannula and Natural Airway  Additional Equipment:   Intra-op Plan:   Post-operative Plan:   Informed Consent: I have reviewed the patients History and Physical, chart, labs and discussed the procedure including the risks, benefits and alternatives for the proposed anesthesia with the patient or authorized representative who has indicated his/her understanding and acceptance.     Dental advisory given  Plan Discussed with: CRNA and Surgeon  Anesthesia Plan Comments:        Anesthesia Quick Evaluation

## 2021-04-26 ENCOUNTER — Encounter: Payer: Self-pay | Admitting: Gastroenterology

## 2021-04-26 LAB — SURGICAL PATHOLOGY

## 2021-04-29 ENCOUNTER — Telehealth: Payer: Self-pay

## 2021-04-29 DIAGNOSIS — A048 Other specified bacterial intestinal infections: Secondary | ICD-10-CM

## 2021-04-29 MED ORDER — TETRACYCLINE HCL 500 MG PO CAPS: 500.0000 mg | ORAL_CAPSULE | Freq: Four times a day (QID) | ORAL | 0 refills | Status: AC

## 2021-04-29 MED ORDER — METRONIDAZOLE 500 MG PO TABS: 500.0000 mg | ORAL_TABLET | Freq: Three times a day (TID) | ORAL | 0 refills | Status: AC

## 2021-04-29 MED ORDER — OMEPRAZOLE 40 MG PO CPDR: 40.0000 mg | DELAYED_RELEASE_CAPSULE | Freq: Two times a day (BID) | ORAL | 0 refills | Status: DC

## 2021-04-29 MED ORDER — BISMUTH SUBSALICYLATE 262 MG PO CHEW: CHEWABLE_TABLET | ORAL | 0 refills | Status: DC

## 2021-04-29 NOTE — Telephone Encounter (Signed)
-----   Message from Toney Reil, MD sent at 04/26/2021  6:30 PM EDT ----- Recurrent H. pylori infection  Recommend quadruple therapy as detailed below for 14 days total  Bismuth subsalicylate 2 tablets each 4 times daily with meals and at bedtime Tetracycline 500 mg 4 times daily with meals and at bedtime Metronidazole 500 mg 3 times daily with meals Omeprazole 40 mg twice daily before breakfast and dinner  Will perform H. pylori breath test in 4 to 6 weeks after completion of therapy  Arlyss Repress, MD [address]  [address]  [place], [place] [address]  Main: [phone]  Fax: [phone] Pager: [phone]

## 2021-04-29 NOTE — Telephone Encounter (Signed)
Sent medication to the pharmacy and did h pylori test in 4 to 6 weeks. Patient read mychart message

## 2021-07-23 IMAGING — US US FNA BIOPSY THYROID 1ST LESION
1 series · 13 of 18 positions shown · non-contrast
Comparison: US Thyroid 08/07/20

MEDICATIONS:
5 cc 1% lidocaine

COMPLICATIONS:
None immediate.

INDICATION: Indeterminate thyroid nodule

Right mid lobe thyroid nodule
1.9 cm
EXAM:
ULTRASOUND GUIDED FINE NEEDLE ASPIRATION OF INDETERMINATE THYROID
NODULE
TECHNIQUE: Informed written consent was obtained from the patient after a
discussion of the risks, benefits and alternatives to treatment.
Questions regarding the procedure were encouraged and answered. A
timeout was performed prior to the initiation of the procedure.

[Series 1: us fna biopsy thyroid 1st lesion · 0.05mm/px · 18 acquisitions, 13 frames shown]
[im 1/18]
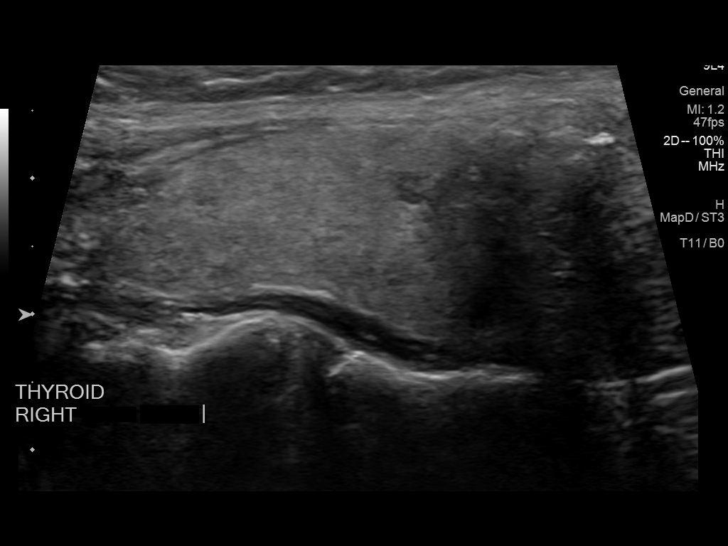
[im 3/18]
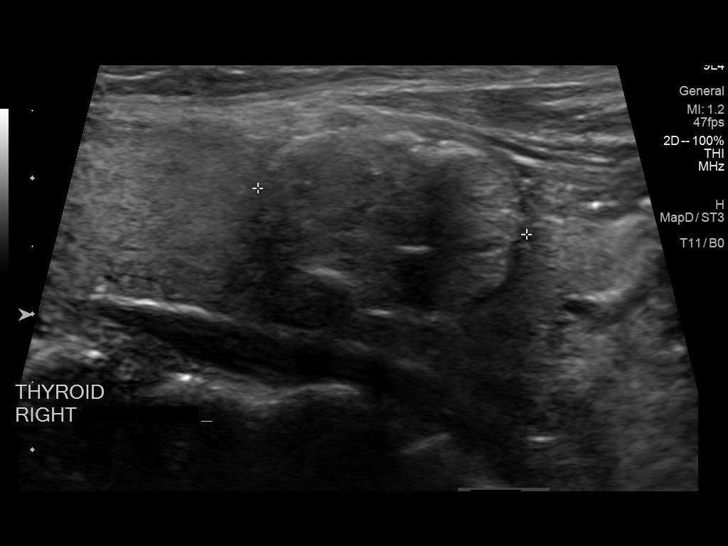
[im 4/18]
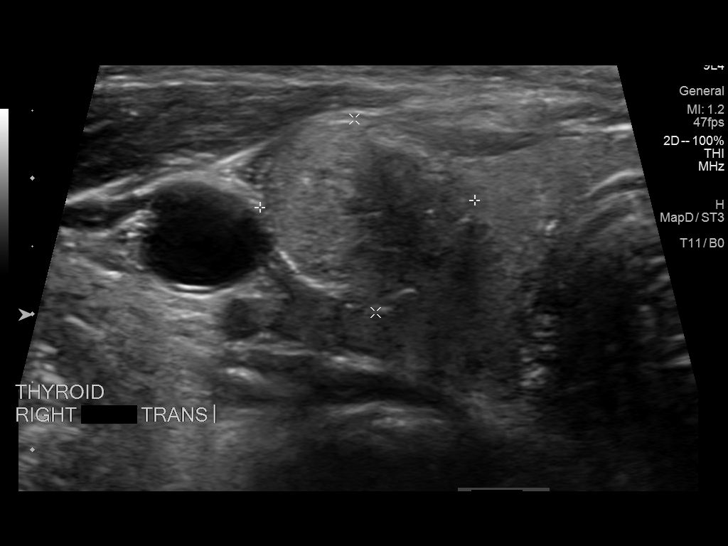
[im 5/18]
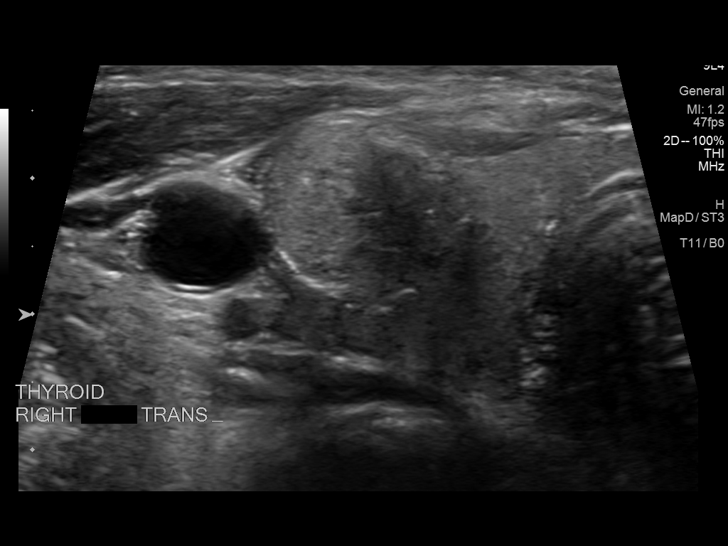
[im 7/18]
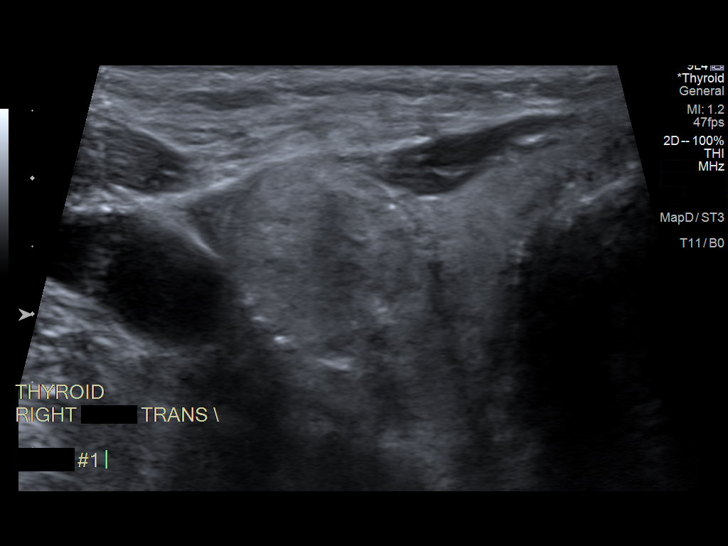
[im 8/18]
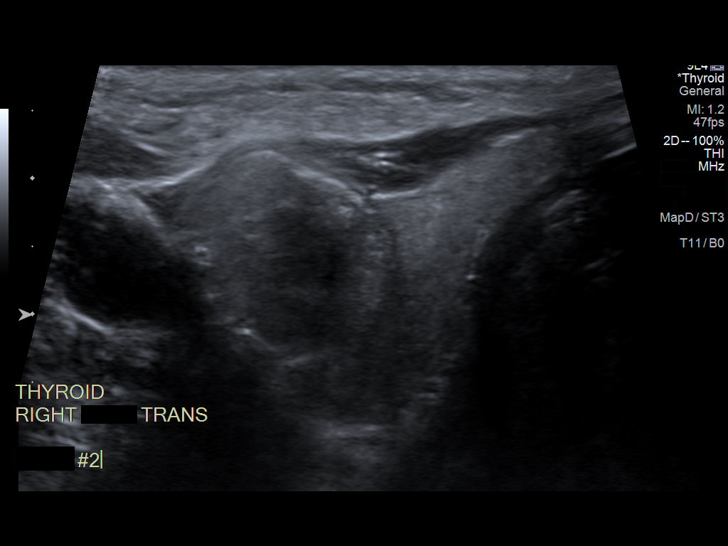
[im 10/18]
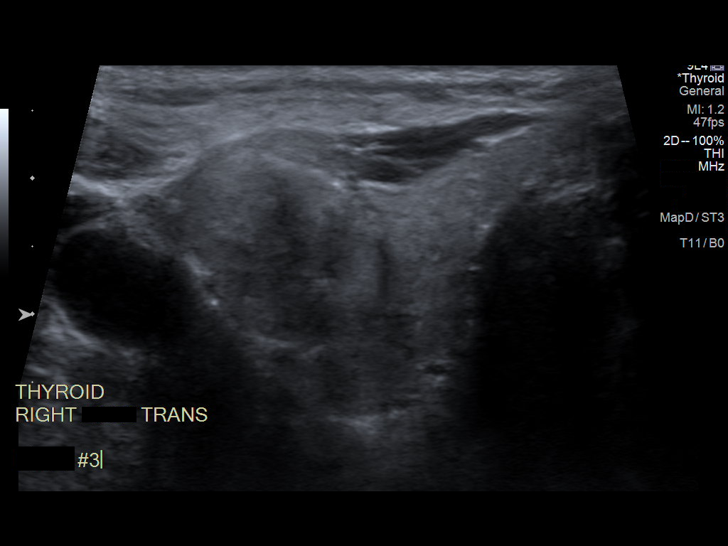
[im 11/18]
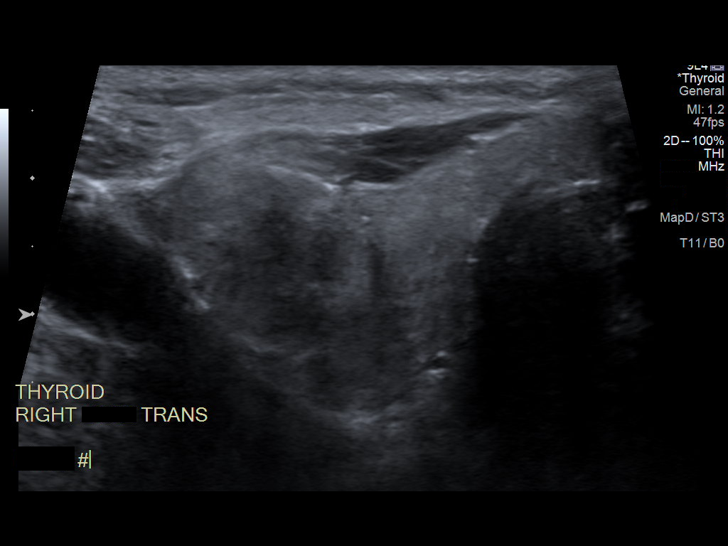
[im 12/18]
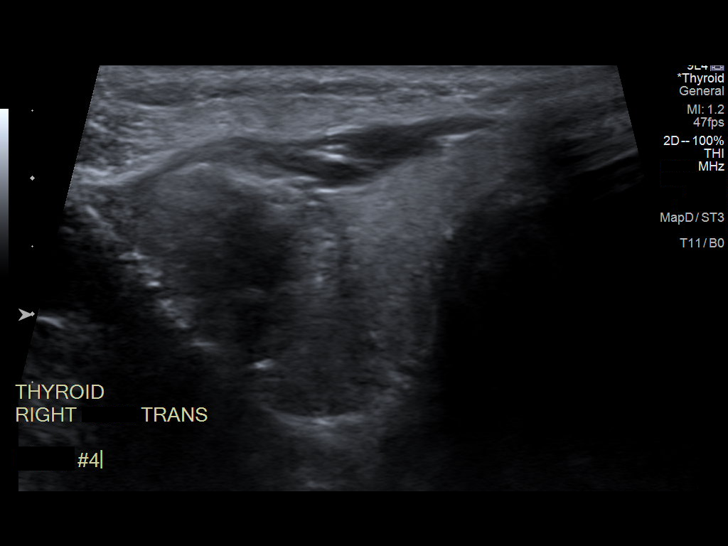
[im 14/18]
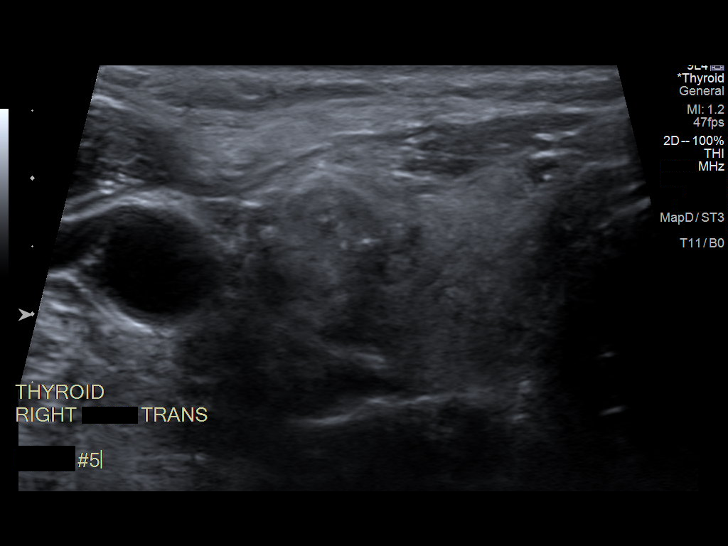
[im 15/18]
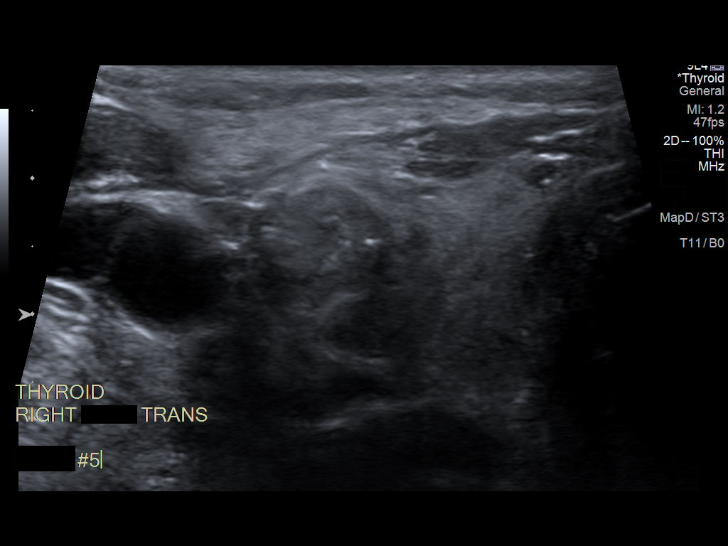
[im 16/18]
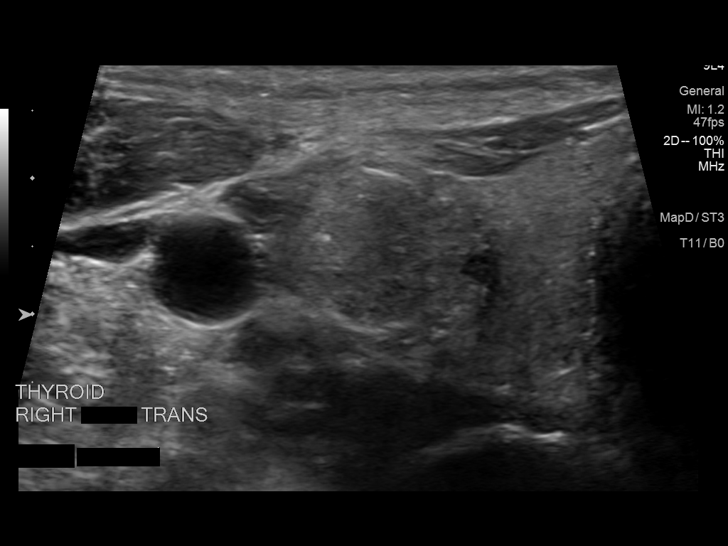
[im 18/18]
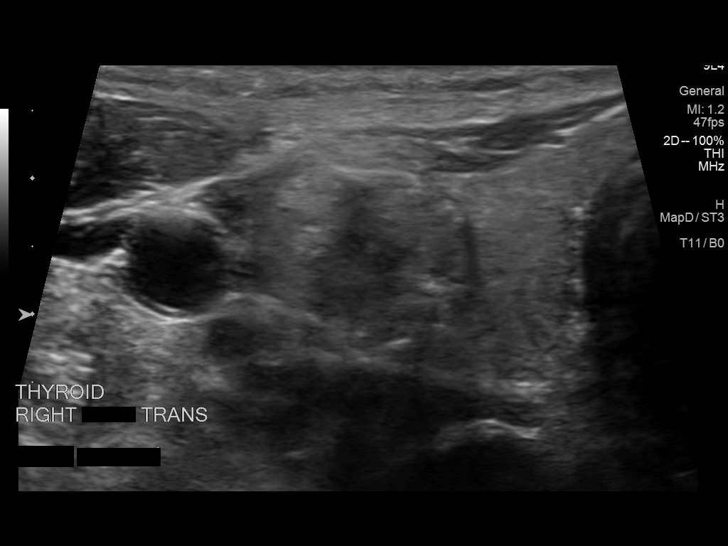

[13 of 18 positions shown; findings below may reference images not displayed]

Pre-procedural ultrasound scanning demonstrated unchanged size and
appearance of the indeterminate nodule within the right thyroid

The procedure was planned. The neck was prepped in the usual sterile
fashion, and a sterile drape was applied covering the operative
field. A timeout was performed prior to the initiation of the
procedure. Local anesthesia was provided with 1% lidocaine.

Under direct ultrasound guidance, 5 FNA biopsies were performed of
the right mid lobe thyroid nodule with a 25 gauge needle.

2 samples were obtained for AFIRMA.

Multiple ultrasound images were saved for procedural documentation
purposes. The samples were prepared and submitted to pathology.

Limited post procedural scanning was negative for hematoma or
additional complication. Dressings were placed. The patient
tolerated the above procedures procedure well without immediate
postprocedural complication.
FINDINGS: Nodule reference number based on prior diagnostic ultrasound: 1

Maximum size: 1.9 cm

Location: Right; Mid

ACR TI-RADS risk category: TR4 (4-6 points)

Reason for biopsy: meets ACR TI-RADS criteria

Ultrasound imaging confirms appropriate placement of the needles
within the thyroid nodule.
IMPRESSION: Technically successful ultrasound guided fine needle aspiration of
right mid lobe thyroid nodule

Read by

Deyvi Schutz

## 2021-10-01 ENCOUNTER — Ambulatory Visit: Payer: 59 | Admitting: Podiatry

## 2021-12-12 ENCOUNTER — Telehealth: Payer: Self-pay

## 2021-12-12 ENCOUNTER — Telehealth: Payer: 59 | Admitting: Physician Assistant

## 2021-12-12 DIAGNOSIS — Z8619 Personal history of other infectious and parasitic diseases: Secondary | ICD-10-CM | POA: Diagnosis not present

## 2021-12-12 MED ORDER — PANTOPRAZOLE SODIUM 40 MG PO TBEC: 40.0000 mg | DELAYED_RELEASE_TABLET | Freq: Two times a day (BID) | ORAL | 0 refills | Status: DC

## 2021-12-12 MED ORDER — SUCRALFATE 1 G PO TABS
1.0000 g | ORAL_TABLET | Freq: Three times a day (TID) | ORAL | 0 refills | Status: DC
Start: 2021-12-12 — End: 2021-12-18

## 2021-12-12 NOTE — Patient Instructions (Signed)
Rachel Arnold, thank you for joining Piedad Climes, PA-C for today's virtual visit.  While this provider is not your primary care provider (PCP), if your PCP is located in our provider database this encounter information will be shared with them immediately following your visit.  Consent: (Patient) Rachel Arnold provided verbal consent for this virtual visit at the beginning of the encounter.  Current Medications:  Current Outpatient Medications:    pantoprazole (PROTONIX) 40 MG tablet, Take 1 tablet (40 mg total) by mouth 2 (two) times daily., Disp: 30 tablet, Rfl: 0   sucralfate (CARAFATE) 1 g tablet, Take 1 tablet (1 g total) by mouth 4 (four) times daily -  with meals and at bedtime for 7 days., Disp: 28 tablet, Rfl: 0   bismuth subsalicylate (PEPTO BISMOL) 262 MG chewable tablet, Chew 3 tablets 4 times a day for 14 days, Disp: 168 tablet, Rfl: 0   escitalopram (LEXAPRO) 20 MG tablet, Take 1 tablet (20 mg total) by mouth daily. (Patient taking differently: Take 20 mg by mouth at bedtime.), Disp: 90 tablet, Rfl: 3   Multiple Vitamins-Minerals (HAIR SKIN AND NAILS FORMULA) TABS, Take 1 tablet by mouth daily., Disp: , Rfl:    saccharomyces boulardii (FLORASTOR) 250 MG capsule, Take 250 mg by mouth daily., Disp: , Rfl:    Medications ordered in this encounter:  Meds ordered this encounter  Medications   pantoprazole (PROTONIX) 40 MG tablet    Sig: Take 1 tablet (40 mg total) by mouth 2 (two) times daily.    Dispense:  30 tablet    Refill:  0    Order Specific Question:   Supervising Provider    Answer:   Hyacinth Meeker, BRIAN [[id number]]   sucralfate (CARAFATE) 1 g tablet    Sig: Take 1 tablet (1 g total) by mouth 4 (four) times daily -  with meals and at bedtime for 7 days.    Dispense:  28 tablet    Refill:  0    Order Specific Question:   Supervising Provider    Answer:   Hyacinth Meeker, BRIAN [[id number]]     *If you need refills on other medications prior to your next appointment, please contact  your pharmacy*  Follow-Up: Call back or seek an in-person evaluation if the symptoms worsen or if the condition fails to improve as anticipated.  Other Instructions Please keep hydrated.  Follow dietary recommendations below. Avoid any alcohol or use of NSAID (Ibuprofen, Aleve, Goody Powders). Tylenol only if needing something for pain. Take the protonix as directed.  Contact Dr. Gates Callas office at [phone] as discussed. I have sent them a message as well. If unable to get you in, please contact your previous PCP office to see if they can work you in with another provider.    If you have been instructed to have an in-person evaluation today at a local Urgent Care facility, please use the link below. It will take you to a list of all of our available [facility], including address, phone number and hours of operation. Please do not delay care.  [facility]  If you or a family member do not have a primary care provider, use the link below to schedule a visit and establish care. When you choose a [facility] primary care physician or advanced practice provider, you gain a long-term partner in health. Find a Primary Care Provider  Learn more about [facility]'s in-office and virtual care options: [facility] -  Get Care Now

## 2021-12-12 NOTE — Telephone Encounter (Signed)
Scheduled in office December 18 2021

## 2021-12-12 NOTE — Progress Notes (Signed)
Virtual Visit Consent   [name], you are scheduled for a virtual visit with a [facility] provider today.     Just as with appointments in the office, your consent must be obtained to participate.  Your consent will be active for this visit and any virtual visit you may have with one of our providers in the next 365 days.     If you have a MyChart account, a copy of this consent can be sent to you electronically.  All virtual visits are billed to your insurance company just like a traditional visit in the office.    As this is a virtual visit, video technology does not allow for your provider to perform a traditional examination.  This may limit your provider's ability to fully assess your condition.  If your provider identifies any concerns that need to be evaluated in person or the need to arrange testing (such as labs, EKG, etc.), we will make arrangements to do so.     Although advances in technology are sophisticated, we cannot ensure that it will always work on either your end or our end.  If the connection with a video visit is poor, the visit may have to be switched to a telephone visit.  With either a video or telephone visit, we are not always able to ensure that we have a secure connection.     I need to obtain your verbal consent now.   Are you willing to proceed with your visit today?    Rachel Arnold has provided verbal consent on 12/12/2021 for a virtual visit (video or telephone).   Rachel Arnold, [place]   Date: 12/12/2021 9:28 AM   Virtual Visit via Video Note   I, Rachel Arnold, connected with  Rachel Arnold  ([id number], March 10, 1961) on 12/12/21 at  9:15 AM EST by a video-enabled telemedicine application and verified that I am speaking with the correct person using two identifiers.  Location: Patient: Virtual Visit Location Patient: Home Provider: Virtual Visit Location Provider: Home Office   I discussed the limitations of evaluation and management  by telemedicine and the availability of in person appointments. The patient expressed understanding and agreed to proceed.    History of Present Illness: Rachel Arnold is a 60 y.o. who identifies as a female who was assigned female at birth, and is being seen today for heartburn and some mild epigastric LUQ discomfort over the past couple of weeks. Notes history of H pylori within the past year causing bleeding ulcers. Was initially managed by PCP with Triple therapy but had continued symptoms. As such was seen in hospital and required transfusion for blood loss from bleeding ulcers. Was evaluated by GI and started on quadruple therapy. She noted resolution of symptoms at that time. Has had recurrence in the past few weeks, thankfully milder but more significant last night. Denies fever, chills, vomiting, melena, hematochezia or tenesmus. Some occasional nausea. Denies change to diet. Is trying to hydrate. She did a course of OTC Prilosec with only some relief. She is wanting re-evaluation for H pylori. Unfortunately her PCP left and she cannot get in with a new provider for some time. She only saw the GI provider at the hospital and so is unsure of how to follow-up with them or if that is possible.   HPI: HPI  Problems:  Patient Active Problem List   Diagnosis Date Noted   H. pylori infection    PUD (peptic ulcer  disease)    Syncope 02/07/2021   Melena 02/07/2021   Coffee ground emesis 02/07/2021   Upper GI bleed 02/07/2021   Elevated LDL cholesterol level 10/04/2020   Anxiety with depression 09/05/2020   Need for influenza vaccination 09/05/2020   Perioral dermatitis 09/05/2020   Fatigue 09/05/2020   Healthcare maintenance 09/05/2020   Hepatic steatosis 09/05/2020   Epigastric pain 07/31/2020   Diarrhea 07/31/2020   Irritable bowel syndrome with diarrhea 07/31/2020   Coronary artery calcification seen on CT scan 07/31/2020   NCGS (non-celiac gluten sensitivity) 07/31/2020   Acute  bronchitis 02/10/2019   Right thyroid nodule 01/03/2019   Aortic atherosclerosis (HCC) 01/03/2019   Nasal lesion 01/03/2019   Microscopic hematuria 10/13/2018   Smoker 10/13/2018   Osteoporosis 10/13/2018   Body mass index (BMI) of 27.0 to 27.9 in adult 10/13/2018   Family history of diabetes mellitus 10/13/2018   Family history of cancer 10/13/2018    Allergies:  Allergies  Allergen Reactions   Penicillins    Medications:  Current Outpatient Medications:    bismuth subsalicylate (PEPTO BISMOL) 262 MG chewable tablet, Chew 3 tablets 4 times a day for 14 days, Disp: 168 tablet, Rfl: 0   escitalopram (LEXAPRO) 20 MG tablet, Take 1 tablet (20 mg total) by mouth daily. (Patient taking differently: Take 20 mg by mouth at bedtime.), Disp: 90 tablet, Rfl: 3   Multiple Vitamins-Minerals (HAIR SKIN AND NAILS FORMULA) TABS, Take 1 tablet by mouth daily., Disp: , Rfl:    omeprazole (PRILOSEC) 40 MG capsule, Take 1 capsule (40 mg total) by mouth in the morning and at bedtime., Disp: 60 capsule, Rfl: 2   omeprazole (PRILOSEC) 40 MG capsule, Take 1 capsule (40 mg total) by mouth 2 (two) times daily before a meal for 14 days., Disp: 28 capsule, Rfl: 0   saccharomyces boulardii (FLORASTOR) 250 MG capsule, Take 250 mg by mouth daily., Disp: , Rfl:   Observations/Objective: Patient is well-developed, well-nourished in no acute distress.  Resting comfortably at home.  Head is normocephalic, atraumatic.  No labored breathing. Speech is clear and coherent with logical content.  Patient is alert and oriented at baseline.   Assessment and Plan: 1. History of Helicobacter pylori infection  Question recurrence or just routine GERD with mild gastritis. Thankfully no alarm signs/symptoms at present. She has been using OTC PPI without full relief. Will Rx Pantoprazole 40 mg to use as directed for 2 week course. Will give Carafate to have on hand and can use for a couple of days if needed. She needs to (1)  reach out to prior PCP office to see about acute visit with another provider there for testing and (2) to reach out to GI provider. Contact information given. Will also send a direct message to Dr. Allegra LaiVanga (GI) to make her aware and see if she is able to help. Patient does need repeat assessment regarding h pylori with test for active infection -- unfortunately this would require her to be off PPI for some time and she is quite symptomatic so they may opt to retreat for h pylori, repeat EGD, etc. Strict UC/ER precautions reviewed.   Follow Up Instructions: I discussed the assessment and treatment plan with the patient. The patient was provided an opportunity to ask questions and all were answered. The patient agreed with the plan and demonstrated an understanding of the instructions.  A copy of instructions were sent to the patient via MyChart unless otherwise noted below.   The patient was  advised to call back or seek an in-person evaluation if the symptoms worsen or if the condition fails to improve as anticipated.  Time:  I spent 12 minutes with the patient via telehealth technology discussing the above problems/concerns.    Piedad Climes, PA-C

## 2021-12-12 NOTE — Telephone Encounter (Signed)
CALLED PATIENT NO ANSWER LEFT VOICEMAIL FOR A CALL BACK ? ?

## 2021-12-18 ENCOUNTER — Encounter: Payer: Self-pay | Admitting: Gastroenterology

## 2021-12-18 ENCOUNTER — Other Ambulatory Visit: Payer: Self-pay

## 2021-12-18 ENCOUNTER — Ambulatory Visit (INDEPENDENT_AMBULATORY_CARE_PROVIDER_SITE_OTHER): Payer: 59 | Admitting: Gastroenterology

## 2021-12-18 VITALS — BP 139/81 | HR 87 | Temp 97.9°F | Ht 67.0 in | Wt 185.0 lb

## 2021-12-18 DIAGNOSIS — K254 Chronic or unspecified gastric ulcer with hemorrhage: Secondary | ICD-10-CM | POA: Insufficient documentation

## 2021-12-18 DIAGNOSIS — B001 Herpesviral vesicular dermatitis: Secondary | ICD-10-CM | POA: Insufficient documentation

## 2021-12-18 DIAGNOSIS — Z8619 Personal history of other infectious and parasitic diseases: Secondary | ICD-10-CM

## 2021-12-18 DIAGNOSIS — R1013 Epigastric pain: Secondary | ICD-10-CM | POA: Diagnosis not present

## 2021-12-18 HISTORY — DX: Chronic or unspecified gastric ulcer with hemorrhage: K25.4

## 2021-12-18 NOTE — Progress Notes (Signed)
Cephas Darby, MD [address]  [place]  [place], [place] [address]  Main: [phone]  Fax: [phone]    Gastroenterology Consultation  Referring Provider:     Libby Maw,* Primary Care Physician:  Libby Maw, MD Primary Gastroenterologist:  Dr. Cephas Darby Reason for Consultation:     Recurrent H. pylori infection        HPI:   Rachel Arnold is a 61 y.o. female referred by Dr. Ethelene Hal, Mortimer Fries, MD  for consultation & management of recurrent H. pylori infection.  Patient was originally admitted to [facility] 02/08/2021 secondary to severe iron deficiency anemia from peptic ulcer disease.  She underwent upper endoscopy which revealed 2 nonbleeding clean-based cratered ulcers in the stomach.  Biopsies confirmed H. pylori.  She was treated with triple therapy.  She had a repeat upper endoscopy in 5/22 which confirmed healing of the ulcers however had persistent H. pylori infection.  Subsequently, she was treated with quadruple therapy.  Patient reports that she has been asymptomatic approximately until 2 months ago when she had recurrence of abdominal cramps, early satiety bloating, loose stools.  Her weight has been stable.  She also reports nausea before meals and rest of the symptoms first started about an hour after a meal.  She eats out regularly, almost on a daily basis.  Her iron deficiency anemia has resolved as of 5/22.  She was supposed to undergo H. pylori breath test to confirm eradication after the second round of treatment, which she did not.  She has been taking omeprazole 20 mg 1-2 times daily for ongoing symptoms.  Patient denies any black stools, coffee-ground emesis or hematemesis or rectal bleeding.  Patient also reports similar symptoms in her family members including her daughter and wondering if the need to be tested as well  NSAIDs: None  Antiplts/Anticoagulants/Anti thrombotics: None  GI Procedures:  Screening colonoscopy at  age 13, reportedly normal She denies any family history of GI malignancy Upper endoscopy 02/08/2021 - Duodenitis. - Normal second portion of the duodenum. - Non-bleeding gastric ulcers with a clean ulcer base ([place] Class III). - Erythematous mucosa in the antrum. Biopsied. - Normal gastric body. Biopsied. - Small hiatal hernia. - Normal gastroesophageal junction and esophagus. DIAGNOSIS:  A.  STOMACH, RANDOM; COLD BIOPSY:  - CHRONIC ACTIVE GASTRITIS WITH HELICOBACTER PYLORI TYPE ORGANISMS.  - NEGATIVE FOR DYSPLASIA AND MALIGNANCY.   Upper endoscopy 04/24/2021 DIAGNOSIS:  A. STOMACH; COLD BIOPSY:  - MODERATE CHRONIC ACTIVE HELICOBACTER-ASSOCIATED GASTRITIS.  - ANTRAL MUCOSA WITH INTESTINAL METAPLASIA.  - IHC FOR H.PYLORI CONFIRMS PRESENCE OF RARE ORGANISMS.  - NEGATIVE FOR DYSPLASIA AND MALIGNANCY. - Normal duodenal bulb and second portion of the duodenum. - Erythematous mucosa in the antrum. - Normal gastric body and incisura. Biopsied. - Normal gastroesophageal junction and esophagus.  Past Medical History:  Diagnosis Date   Depression    Seasonal mood disorder ([place])    Thyroid goiter     Past Surgical History:  Procedure Laterality Date   ESOPHAGOGASTRODUODENOSCOPY (EGD) WITH PROPOFOL N/A 02/08/2021   Procedure: ESOPHAGOGASTRODUODENOSCOPY (EGD) WITH PROPOFOL;  Surgeon: Lin Landsman, MD;  Location: [facility] ENDOSCOPY;  Service: Gastroenterology;  Laterality: N/A;   ESOPHAGOGASTRODUODENOSCOPY (EGD) WITH PROPOFOL N/A 04/24/2021   Procedure: ESOPHAGOGASTRODUODENOSCOPY (EGD) WITH PROPOFOL;  Surgeon: Lin Landsman, MD;  Location: [facility] ENDOSCOPY;  Service: Gastroenterology;  Laterality: N/A;   FACIAL COSMETIC SURGERY  12/2020   RHINOPLASTY  12/2020   stress myoview  12/22/2008  EF 69%; LV norm.    Current Outpatient Medications:    amphetamine-dextroamphetamine (ADDERALL) 10 MG tablet, Take 10 mg by mouth daily., Disp: , Rfl:    Multiple Vitamins-Minerals (HAIR SKIN  AND NAILS FORMULA) TABS, Take 1 tablet by mouth daily., Disp: , Rfl:    omeprazole (PRILOSEC) 20 MG capsule, Take 20 mg by mouth daily., Disp: , Rfl:    phentermine (ADIPEX-P) 37.5 MG tablet, Take 37.5 mg by mouth every morning., Disp: , Rfl:    saccharomyces boulardii (FLORASTOR) 250 MG capsule, Take 250 mg by mouth daily., Disp: , Rfl:    sertraline (ZOLOFT) 50 MG tablet, Take 50 mg by mouth daily., Disp: , Rfl:     Family History  Problem Relation Age of Onset   Congestive Heart Failure Mother    Coronary artery disease Mother    Diabetes Mother    Hypertension Mother    Cancer Father    Cancer Brother        Metastatic   Coronary artery disease Maternal Grandmother    Diabetes Maternal Grandmother    Hypertension Maternal Grandmother    Early death Brother    Drug abuse Brother      Social History   Tobacco Use   Smoking status: Former    Packs/day: 0.00    Years: 20.00    Pack years: 0.00    Types: Cigarettes    Quit date: 08/08/2020    Years since quitting: 1.3   Smokeless tobacco: Never   Tobacco comments:    chantix prescribed  04/12/2012  Vaping Use   Vaping Use: Never used  Substance Use Topics   Alcohol use: Never    Comment: Occas   Drug use: Never    Allergies as of 12/18/2021 - Review Complete 12/18/2021  Allergen Reaction Noted   Penicillins  02/28/2013    Review of Systems:    All systems reviewed and negative except where noted in HPI.   Physical Exam:  BP 139/81 (BP Location: Left Arm, Patient Position: Sitting, Cuff Size: Normal)    Pulse 87    Temp 97.9 F (36.6 C) (Oral)    Ht 5\' 7"  (1.702 m)    Wt 185 lb (83.9 kg)    BMI 28.98 kg/m  No LMP recorded. Patient is postmenopausal.  General:   Alert,  Well-developed, well-nourished, pleasant and cooperative in NAD Head:  Normocephalic and atraumatic. Eyes:  Sclera clear, no icterus.   Conjunctiva pink. Ears:  Normal auditory acuity. Nose:  No deformity, discharge, or lesions. Mouth:  No  deformity or lesions,oropharynx pink & moist. Neck:  Supple; no masses or thyromegaly. Lungs:  Respirations even and unlabored.  Clear throughout to auscultation.   No wheezes, crackles, or rhonchi. No acute distress. Heart:  Regular rate and rhythm; no murmurs, clicks, rubs, or gallops. Abdomen:  Normal bowel sounds. Soft, non-tender and non-distended without masses, hepatosplenomegaly or hernias noted.  No guarding or rebound tenderness.   Rectal: Not performed Msk:  Symmetrical without gross deformities. Good, equal movement & strength bilaterally. Pulses:  Normal pulses noted. Extremities:  No clubbing or edema.  No cyanosis. Neurologic:  Alert and oriented x3;  grossly normal neurologically. Skin:  Intact without significant lesions or rashes. No jaundice. Psych:  Alert and cooperative. Normal mood and affect.  Imaging Studies: Reviewed  Assessment and Plan:   Rachel Arnold is a 61 y.o. female with no significant past medical history is seen in consultation for peptic ulcer disease and iron  deficiency anemia secondary to H. pylori infection, s/p triple therapy in 01/2021, quadruple therapy in 04/2021.  Patient reports 2 months history of recurrence of symptoms of dyspepsia which is concerning for either recurrence of peptic ulcer disease or H. pylori infection  Perform H. pylori breath test today.  Patient is currently on low-dose omeprazole.  Informed patient that if this test comes back negative, she has to be of PPI for 2 weeks before repeating the test and possibly an upper endoscopy with repeat biopsies and also to rule out peptic ulcer disease Check CBC, iron panel Patient is agreeable with the above plan   Follow up based on the above test results and patient will contact via MyChart   Cephas Darby, MD

## 2021-12-19 LAB — CBC
Hematocrit: 37.6 % (ref 34.0–46.6)
Hemoglobin: 12.8 g/dL (ref 11.1–15.9)
MCH: 28.6 pg (ref 26.6–33.0)
MCHC: 34 g/dL (ref 31.5–35.7)
MCV: 84 fL (ref 79–97)
Platelets: 253 10*3/uL (ref 150–450)
RBC: 4.47 x10E6/uL (ref 3.77–5.28)
RDW: 13.3 % (ref 11.7–15.4)
WBC: 8.1 10*3/uL (ref 3.4–10.8)

## 2021-12-19 LAB — IRON,TIBC AND FERRITIN PANEL
Ferritin: 160 ng/mL — ABNORMAL HIGH (ref 15–150)
Iron Saturation: 25 % (ref 15–55)
Iron: 78 ug/dL (ref 27–159)
Total Iron Binding Capacity: 306 ug/dL (ref 250–450)
UIBC: 228 ug/dL (ref 131–425)

## 2021-12-20 ENCOUNTER — Telehealth: Payer: Self-pay

## 2021-12-20 DIAGNOSIS — Z8619 Personal history of other infectious and parasitic diseases: Secondary | ICD-10-CM

## 2021-12-20 LAB — H. PYLORI BREATH TEST: H pylori Breath Test: NEGATIVE

## 2021-12-20 NOTE — Telephone Encounter (Signed)
-----   Message from Toney Reil, MD sent at 12/20/2021  8:53 AM EST ----- Rachel Arnold  Please inform patient that her H. pylori breath test came back negative.  I discussed with her that it could be false negative because she is on omeprazole.  Please check with her if she would like to either proceed with upper endoscopy or repeat H. pylori breath test after staying off of omeprazole for 2 weeks.  I have already discussed this during office visit  Rohini Vanga

## 2021-12-20 NOTE — Telephone Encounter (Signed)
Called and left a message for call back  

## 2021-12-23 NOTE — Telephone Encounter (Signed)
Patient states she would like to repeat the H pylori breath test when she is off the omeprazole for 2 weeks. She state she will stop the medication and then come to our lab to have the test done. Gave her the lab hours

## 2022-01-20 ENCOUNTER — Other Ambulatory Visit: Payer: Self-pay | Admitting: Family Medicine

## 2022-01-20 ENCOUNTER — Encounter: Payer: Self-pay | Admitting: Family Medicine

## 2022-01-20 ENCOUNTER — Telehealth (INDEPENDENT_AMBULATORY_CARE_PROVIDER_SITE_OTHER): Payer: 59 | Admitting: Family Medicine

## 2022-01-20 VITALS — Ht 67.0 in

## 2022-01-20 DIAGNOSIS — J4521 Mild intermittent asthma with (acute) exacerbation: Secondary | ICD-10-CM

## 2022-01-20 DIAGNOSIS — B349 Viral infection, unspecified: Secondary | ICD-10-CM | POA: Diagnosis not present

## 2022-01-20 MED ORDER — PREDNISONE 20 MG PO TABS: 20.0000 mg | ORAL_TABLET | Freq: Two times a day (BID) | ORAL | 0 refills | Status: AC

## 2022-01-20 MED ORDER — BENZONATATE 200 MG PO CAPS: 200.0000 mg | ORAL_CAPSULE | Freq: Two times a day (BID) | ORAL | 0 refills | Status: DC | PRN

## 2022-01-20 MED ORDER — ALBUTEROL SULFATE HFA 108 (90 BASE) MCG/ACT IN AERS: 1.0000 | INHALATION_SPRAY | Freq: Four times a day (QID) | RESPIRATORY_TRACT | 2 refills | Status: DC | PRN

## 2022-01-20 NOTE — Progress Notes (Signed)
Established Patient Office Visit  Subjective:  Patient ID: Rachel Arnold, female    DOB: 10-11-1961  Age: 61 y.o. MRN: [id number]  CC:  Chief Complaint  Patient presents with   Nasal Congestion    Nasal and chest congestion x 3 days.     HPI Rachel Arnold presents for 3 day history of nasal congestion, pnd, cough, wheezing. No asthma hx. Quit tobacco in 07/2020. No f/c. No headache, myalgias or arthralgias. Home Covid test negative.   Past Medical History:  Diagnosis Date   Depression    Diarrhea 07/31/2020   Epigastric pain 07/31/2020   Fatigue 09/05/2020   Gastric ulcer with hemorrhage 12/18/2021   Seasonal mood disorder ([place])    Thyroid goiter     Past Surgical History:  Procedure Laterality Date   ESOPHAGOGASTRODUODENOSCOPY (EGD) WITH PROPOFOL N/A 02/08/2021   Procedure: ESOPHAGOGASTRODUODENOSCOPY (EGD) WITH PROPOFOL;  Surgeon: Lin Landsman, MD;  Location: [facility] ENDOSCOPY;  Service: Gastroenterology;  Laterality: N/A;   ESOPHAGOGASTRODUODENOSCOPY (EGD) WITH PROPOFOL N/A 04/24/2021   Procedure: ESOPHAGOGASTRODUODENOSCOPY (EGD) WITH PROPOFOL;  Surgeon: Lin Landsman, MD;  Location: [facility] ENDOSCOPY;  Service: Gastroenterology;  Laterality: N/A;   FACIAL COSMETIC SURGERY  12/2020   RHINOPLASTY  12/2020   stress myoview  12/22/2008   EF 69%; LV norm.    Family History  Problem Relation Age of Onset   Congestive Heart Failure Mother    Coronary artery disease Mother    Diabetes Mother    Hypertension Mother    Cancer Father    Cancer Brother        Metastatic   Coronary artery disease Maternal Grandmother    Diabetes Maternal Grandmother    Hypertension Maternal Grandmother    Early death Brother    Drug abuse Brother     Social History   Socioeconomic History   Marital status: Married    Spouse name: Not on file   Number of children: Not on file   Years of education: Not on file   Highest education level: Not on file  Occupational History   Not on  file  Tobacco Use   Smoking status: Former    Packs/day: 0.00    Years: 20.00    Pack years: 0.00    Types: Cigarettes    Quit date: 08/08/2020    Years since quitting: 1.4   Smokeless tobacco: Never   Tobacco comments:    chantix prescribed  04/12/2012  Vaping Use   Vaping Use: Never used  Substance and Sexual Activity   Alcohol use: Never    Comment: Occas   Drug use: Never   Sexual activity: Not Currently    Birth control/protection: Post-menopausal  Other Topics Concern   Not on file  Social History Narrative   Not on file   Social Determinants of Health   Financial Resource Strain: Not on file  Food Insecurity: Not on file  Transportation Needs: Not on file  Physical Activity: Not on file  Stress: Not on file  Social Connections: Not on file  Intimate Partner Violence: Not on file    Outpatient Medications Prior to Visit  Medication Sig Dispense Refill   Multiple Vitamins-Minerals (HAIR SKIN AND NAILS FORMULA) TABS Take 1 tablet by mouth daily.     saccharomyces boulardii (FLORASTOR) 250 MG capsule Take 250 mg by mouth daily.     sertraline (ZOLOFT) 50 MG tablet Take 50 mg by mouth daily.     amphetamine-dextroamphetamine (ADDERALL) 10 MG tablet  Take 10 mg by mouth daily. (Patient not taking: Reported on 01/20/2022)     omeprazole (PRILOSEC) 20 MG capsule Take 20 mg by mouth daily. (Patient not taking: Reported on 01/20/2022)     phentermine (ADIPEX-P) 37.5 MG tablet Take 37.5 mg by mouth every morning. (Patient not taking: Reported on 01/20/2022)     No facility-administered medications prior to visit.    Allergies  Allergen Reactions   Penicillins     ROS Review of Systems  Constitutional:  Positive for fatigue. Negative for diaphoresis, fever and unexpected weight change.  HENT:  Positive for congestion, postnasal drip and rhinorrhea.   Respiratory:  Positive for cough and wheezing. Negative for chest tightness and shortness of breath.   Cardiovascular:  Negative.   Gastrointestinal: Negative.   Musculoskeletal:  Negative for arthralgias and myalgias.  Neurological:  Negative for headaches.     Objective:    Physical Exam Vitals and nursing note reviewed.  Constitutional:      General: She is not in acute distress.    Appearance: Normal appearance. She is not ill-appearing, toxic-appearing or diaphoretic.  HENT:     Head: Normocephalic and atraumatic.  Eyes:     General: No scleral icterus.       Right eye: No discharge.        Left eye: No discharge.     Extraocular Movements: Extraocular movements intact.     Conjunctiva/sclera: Conjunctivae normal.  Pulmonary:     Effort: Pulmonary effort is normal.  Neurological:     Mental Status: She is alert and oriented to person, place, and time.  Psychiatric:        Mood and Affect: Mood normal.        Behavior: Behavior normal.    Ht 5\' 7"  (1.702 m)    BMI 28.98 kg/m  Wt Readings from Last 3 Encounters:  12/18/21 185 lb (83.9 kg)  04/24/21 184 lb 5.9 oz (83.6 kg)  02/14/21 185 lb 6.4 oz (84.1 kg)     Health Maintenance Due  Topic Date Due   Zoster Vaccines- Shingrix (1 of 2) Never done   MAMMOGRAM  09/13/2020   COLONOSCOPY (Pts 45-45yrs Insurance coverage will need to be confirmed)  01/15/2022    There are no preventive care reminders to display for this patient.  Lab Results  Component Value Date   TSH 1.05 09/11/2020   Lab Results  Component Value Date   WBC 8.1 12/18/2021   HGB 12.8 12/18/2021   HCT 37.6 12/18/2021   MCV 84 12/18/2021   PLT 253 12/18/2021   Lab Results  Component Value Date   NA 142 02/08/2021   K 3.8 02/08/2021   CO2 25 02/08/2021   GLUCOSE 92 02/08/2021   BUN 28 (H) 02/08/2021   CREATININE 0.77 02/08/2021   BILITOT 0.6 02/07/2021   ALKPHOS 51 02/07/2021   AST 18 02/07/2021   ALT 21 02/07/2021   PROT 5.6 (L) 02/07/2021   ALBUMIN 3.4 (L) 02/07/2021   CALCIUM 8.4 (L) 02/08/2021   ANIONGAP 5 02/08/2021   GFR 65.85 12/27/2020    Lab Results  Component Value Date   CHOL 191 09/11/2020   Lab Results  Component Value Date   HDL 44.30 09/11/2020   Lab Results  Component Value Date   LDLCALC 118 (H) 09/11/2020   Lab Results  Component Value Date   TRIG 140.0 09/11/2020   Lab Results  Component Value Date   CHOLHDL 4 09/11/2020   Lab Results  Component Value Date   HGBA1C 5.7 10/13/2018      Assessment & Plan:   Problem List Items Addressed This Visit   None Visit Diagnoses     Viral illness    -  Primary   Relevant Orders   COVID-19, Flu A+B and RSV   Mild intermittent reactive airway disease with acute exacerbation       Relevant Medications   predniSONE (DELTASONE) 20 MG tablet   albuterol (VENTOLIN HFA) 108 (90 Base) MCG/ACT inhaler   benzonatate (TESSALON) 200 MG capsule       Meds ordered this encounter  Medications   predniSONE (DELTASONE) 20 MG tablet    Sig: Take 1 tablet (20 mg total) by mouth 2 (two) times daily with a meal for 7 days.    Dispense:  14 tablet    Refill:  0   albuterol (VENTOLIN HFA) 108 (90 Base) MCG/ACT inhaler    Sig: Inhale 1-2 puffs into the lungs every 6 (six) hours as needed for wheezing or shortness of breath. And or cough.    Dispense:  8 g    Refill:  2   benzonatate (TESSALON) 200 MG capsule    Sig: Take 1 capsule (200 mg total) by mouth 2 (two) times daily as needed for cough.    Dispense:  20 capsule    Refill:  0    Follow-up: Return if symptoms worsen or fail to improve.   We will treat reactive airways disease with a prednisone burst and albuterol inhaler and Tessalon as needed.  Follow-up if not improving by the end of the week. Libby Maw, MD Virtual Visit via Video Note  I connected with Ander Gaster on 01/20/22 at  4:00 PM EST by a video enabled telemedicine application and verified that I am speaking with the correct person using two identifiers.  Location: Patient: work alone in her office.  Provider: work   I  discussed the limitations of evaluation and management by telemedicine and the availability of in person appointments. The patient expressed understanding and agreed to proceed.  History of Present Illness:    Observations/Objective:   Assessment and Plan:   Follow Up Instructions:    I discussed the assessment and treatment plan with the patient. The patient was provided an opportunity to ask questions and all were answered. The patient agreed with the plan and demonstrated an understanding of the instructions.   The patient was advised to call back or seek an in-person evaluation if the symptoms worsen or if the condition fails to improve as anticipated.  I provided 25 minutes of non-face-to-face time during this encounter.   Libby Maw, MD

## 2022-01-22 LAB — COVID-19, FLU A+B AND RSV
Influenza A, NAA: NOT DETECTED
Influenza B, NAA: NOT DETECTED
RSV, NAA: NOT DETECTED
SARS-CoV-2, NAA: NOT DETECTED

## 2022-04-29 ENCOUNTER — Ambulatory Visit (INDEPENDENT_AMBULATORY_CARE_PROVIDER_SITE_OTHER): Payer: 59 | Admitting: Family Medicine

## 2022-04-29 ENCOUNTER — Encounter: Payer: Self-pay | Admitting: Family Medicine

## 2022-04-29 ENCOUNTER — Ambulatory Visit (INDEPENDENT_AMBULATORY_CARE_PROVIDER_SITE_OTHER): Payer: 59

## 2022-04-29 ENCOUNTER — Other Ambulatory Visit: Payer: Self-pay | Admitting: Family Medicine

## 2022-04-29 VITALS — BP 120/88 | HR 93 | Temp 96.5°F | Ht 67.0 in | Wt 182.0 lb

## 2022-04-29 DIAGNOSIS — M79674 Pain in right toe(s): Secondary | ICD-10-CM | POA: Diagnosis not present

## 2022-04-29 NOTE — Progress Notes (Signed)
? ?  Acute Office Visit ? ?Subjective:  ? ?  ?Patient ID: Rachel Arnold, female    DOB: 1961-06-02, 61 y.o.   MRN: [id number] ? ?Chief Complaint  ?Patient presents with  ? Acute Visit  ?  Pt c/o Broken toe x1 week states that last night it started hurting and swelling ?Not taking any pain meds  ? ? ?Foot Injury  ?The incident occurred 3 to 5 days ago. The incident occurred at home. The injury mechanism was a direct blow. The pain is present in the right toes. The quality of the pain is described as aching. The pain is at a severity of 5/10. The pain is moderate. The pain has been Intermittent since onset. Pertinent negatives include no inability to bear weight, loss of motion, loss of sensation, muscle weakness, numbness or tingling. She reports no foreign bodies present. The symptoms are aggravated by movement and weight bearing. She has tried rest for the symptoms. The treatment provided mild relief.  ? ? ?Review of Systems  ?Neurological:  Negative for tingling and numbness.  ? ? ?   ?Objective:  ?  ?BP 120/88 (BP Location: Left Arm, Patient Position: Sitting, Cuff Size: Normal)   Pulse 93   Temp (!) 96.5 ?F (35.8 ?C) (Temporal)   Ht 5\' 7"  (1.702 m)   Wt 182 lb (82.6 kg)   SpO2 95%   BMI 28.51 kg/m?  ? ?Physical Exam ?Musculoskeletal:  ?   Right foot: Normal range of motion and normal capillary refill. Tenderness and bony tenderness present. No swelling, deformity, bunion, Charcot foot, foot drop, prominent metatarsal heads or crepitus. Normal pulse.  ?   Comments: Normal flexion/extension of toes, tender on ventral foot near MCP of 5th metatarsal  ? ? ? ?No results found for any visits on 04/29/22. ? ? ?   ?Assessment & Plan:  ? ?Problem List Items Addressed This Visit   ?None ?Visit Diagnoses   ? ? Pain of toe of right foot    -  Primary  ? Relevant Orders  ? DG Foot Complete Right  ? ?  ? ? ?No orders of the defined types were placed in this encounter. ? ? ?Return if symptoms worsen or fail to  improve. ? ?05/01/22, MD ? ? ?

## 2022-04-29 NOTE — Assessment & Plan Note (Signed)
Occurred after stroke on couch ?Tenderness at fifth metatarsal/ventral foot ?X-ray foot ?Offered fracture shoe, however patient declined ?Recommend supportive footwear ?OTC pain medication for supportive care ?Follow-up as needed ? ?

## 2022-04-29 NOTE — Patient Instructions (Signed)
We are getting x-rays today,I do not see any fracture.  We will have this read by radiologist as well and call you with results. ?As we discussed you can try a fracture shoe which may purchase on [place] or you can try supportive footwear such as tennis shoes with good insoles.  May take ibuprofen or Tylenol for pain. ?

## 2022-05-01 ENCOUNTER — Telehealth: Payer: Self-pay | Admitting: Family Medicine

## 2022-05-01 NOTE — Telephone Encounter (Signed)
Patient seen Dr. Janee Morn who ordered Xray of foot. Have reviewed results and patient aware of results and recommendations.

## 2022-05-01 NOTE — Telephone Encounter (Signed)
Rachel Arnold from [facility] is wanting a call back to give a report on her DG Foot Complete Right (Accession [id number]) (Order [id number]) that was done yesterday. CB # [id number]

## 2022-10-20 ENCOUNTER — Ambulatory Visit (INDEPENDENT_AMBULATORY_CARE_PROVIDER_SITE_OTHER): Payer: 59 | Admitting: Nurse Practitioner

## 2022-10-20 ENCOUNTER — Encounter: Payer: Self-pay | Admitting: Nurse Practitioner

## 2022-10-20 VITALS — BP 124/82 | HR 80 | Temp 96.8°F | Resp 20 | Ht 67.0 in | Wt 182.6 lb

## 2022-10-20 DIAGNOSIS — Z1211 Encounter for screening for malignant neoplasm of colon: Secondary | ICD-10-CM

## 2022-10-20 DIAGNOSIS — Z8249 Family history of ischemic heart disease and other diseases of the circulatory system: Secondary | ICD-10-CM | POA: Diagnosis not present

## 2022-10-20 DIAGNOSIS — Z8619 Personal history of other infectious and parasitic diseases: Secondary | ICD-10-CM | POA: Diagnosis not present

## 2022-10-20 DIAGNOSIS — I700 Atherosclerosis of aorta: Secondary | ICD-10-CM

## 2022-10-20 DIAGNOSIS — Z1212 Encounter for screening for malignant neoplasm of rectum: Secondary | ICD-10-CM

## 2022-10-20 DIAGNOSIS — E049 Nontoxic goiter, unspecified: Secondary | ICD-10-CM | POA: Insufficient documentation

## 2022-10-20 DIAGNOSIS — K580 Irritable bowel syndrome with diarrhea: Secondary | ICD-10-CM

## 2022-10-20 LAB — CBC WITH DIFFERENTIAL/PLATELET
Absolute Monocytes: 658 cells/uL (ref 200–950)
Basophils Absolute: 66 cells/uL (ref 0–200)
Basophils Relative: 0.7 %
Eosinophils Absolute: 113 cells/uL (ref 15–500)
Eosinophils Relative: 1.2 %
HCT: 39.9 % (ref 35.0–45.0)
Hemoglobin: 13.8 g/dL (ref 11.7–15.5)
Lymphs Abs: 2378 cells/uL (ref 850–3900)
MCH: 28.5 pg (ref 27.0–33.0)
MCHC: 34.6 g/dL (ref 32.0–36.0)
MCV: 82.4 fL (ref 80.0–100.0)
MPV: 11.7 fL (ref 7.5–12.5)
Monocytes Relative: 7 %
Neutro Abs: 6185 cells/uL (ref 1500–7800)
Neutrophils Relative %: 65.8 %
Platelets: 266 10*3/uL (ref 140–400)
RBC: 4.84 10*6/uL (ref 3.80–5.10)
RDW: 12.4 % (ref 11.0–15.0)
Total Lymphocyte: 25.3 %
WBC: 9.4 10*3/uL (ref 3.8–10.8)

## 2022-10-20 LAB — LIPID PANEL
Cholesterol: 211 mg/dL — ABNORMAL HIGH (ref <200)
HDL: 51 mg/dL (ref >=50)
LDL Cholesterol (Calc): 118 mg/dL (calc) — ABNORMAL HIGH
Non-HDL Cholesterol (Calc): 160 mg/dL (calc) — ABNORMAL HIGH (ref <130)
Total CHOL/HDL Ratio: 4.1 (calc) (ref <5.0)
Triglycerides: 270 mg/dL — ABNORMAL HIGH (ref <150)

## 2022-10-20 LAB — COMPLETE METABOLIC PANEL WITH GFR
AG Ratio: 2 (calc) (ref 1.0–2.5)
ALT: 22 U/L (ref 6–29)
AST: 14 U/L (ref 10–35)
Albumin: 4.9 g/dL (ref 3.6–5.1)
Alkaline phosphatase (APISO): 66 U/L (ref 37–153)
BUN: 13 mg/dL (ref 7–25)
CO2: 28 mmol/L (ref 20–32)
Calcium: 9.7 mg/dL (ref 8.6–10.4)
Chloride: 102 mmol/L (ref 98–110)
Creat: 0.96 mg/dL (ref 0.50–1.05)
Globulin: 2.4 g/dL (calc) (ref 1.9–3.7)
Glucose, Bld: 71 mg/dL (ref 65–99)
Potassium: 4.4 mmol/L (ref 3.5–5.3)
Sodium: 139 mmol/L (ref 135–146)
Total Bilirubin: 0.5 mg/dL (ref 0.2–1.2)
Total Protein: 7.3 g/dL (ref 6.1–8.1)
eGFR: 68 mL/min/{1.73_m2} (ref >=60)

## 2022-10-20 NOTE — Progress Notes (Unsigned)
Careteam: Patient Care Team: Libby Maw, MD as PCP - General (Family Medicine)  PLACE OF SERVICE:  [place] Directive information    Allergies  Allergen Reactions   Penicillins     Chief Complaint  Patient presents with   New Patient (Initial Visit)    Patient presents today for new patient appointment.      HPI: Patient is a 61 y.o. female to establish care.  Last physical was several years ago/before COVID.  Has appt in a few weeks for mammogram and PAP   GERD- using omeprazole will take for 7 days, once a quarter.   Dr Corinna Capra giving her phentermine- started 2 months   Depression- mild depression- on zoloft 100 mg daily   She watches her grandchildren 67 and 87 year old.   In February of 2022 went to the ED with GI bleed, found to have H. Pylori.  Ulcer healed but continued to have H pyloria. She continues to have diarrhea.  She had to take 2 rounds of antibiotics  She has not continued to follow up with GI but wants to establish with a new GI  She is due for colonscopy.   She is having issues with her vaginal wall - followed by gyn.   Review of Systems:  Review of Systems  Constitutional:  Negative for chills, fever and weight loss.  HENT:  Negative for tinnitus.   Respiratory:  Negative for cough, sputum production and shortness of breath.   Cardiovascular:  Negative for chest pain, palpitations and leg swelling.  Gastrointestinal:  Positive for diarrhea. Negative for abdominal pain, constipation and heartburn.  Genitourinary:  Negative for dysuria, frequency and urgency.  Musculoskeletal:  Negative for back pain, falls, joint pain and myalgias.  Skin: Negative.   Neurological:  Negative for dizziness and headaches.  Psychiatric/Behavioral:  Negative for depression and memory loss. The patient does not have insomnia.     Past Medical History:  Diagnosis Date   Depression    Diarrhea 07/31/2020   Epigastric pain 07/31/2020    Fatigue 09/05/2020   Gastric ulcer with hemorrhage 12/18/2021   Seasonal mood disorder ([place])    Thyroid goiter    Past Surgical History:  Procedure Laterality Date   ESOPHAGOGASTRODUODENOSCOPY (EGD) WITH PROPOFOL N/A 02/08/2021   Procedure: ESOPHAGOGASTRODUODENOSCOPY (EGD) WITH PROPOFOL;  Surgeon: Lin Landsman, MD;  Location: [facility] ENDOSCOPY;  Service: Gastroenterology;  Laterality: N/A;   ESOPHAGOGASTRODUODENOSCOPY (EGD) WITH PROPOFOL N/A 04/24/2021   Procedure: ESOPHAGOGASTRODUODENOSCOPY (EGD) WITH PROPOFOL;  Surgeon: Lin Landsman, MD;  Location: [facility] ENDOSCOPY;  Service: Gastroenterology;  Laterality: N/A;   FACIAL COSMETIC SURGERY  12/2020   RHINOPLASTY  12/2020   stress myoview  12/22/2008   EF 69%; LV norm.   Social History:   reports that she quit smoking about 2 years ago. Her smoking use included cigarettes. She has never used smokeless tobacco. She reports that she does not currently use alcohol. She reports that she does not use drugs.  Family History  Problem Relation Age of Onset   Congestive Heart Failure Mother    Coronary artery disease Mother    Diabetes Mother    Hypertension Mother    Cancer Father    Early death Brother    Cancer Brother        Metastatic   Drug abuse Brother    Early death Brother    Coronary artery disease Maternal Grandmother    Diabetes Maternal Grandmother  Hypertension Maternal Grandmother     Medications: Patient's Medications  New Prescriptions   No medications on file  Previous Medications   MULTIPLE VITAMINS-MINERALS (HAIR SKIN AND NAILS FORMULA) TABS    Take 1 tablet by mouth daily.   OMEPRAZOLE (PRILOSEC) 20 MG CAPSULE    Take 20 mg by mouth as needed.   PHENTERMINE (ADIPEX-P) 37.5 MG TABLET    Take 37.5 mg by mouth every morning.   SACCHAROMYCES BOULARDII (FLORASTOR) 250 MG CAPSULE    Take 500 mg by mouth daily.   SERTRALINE (ZOLOFT) 100 MG TABLET    Take 100 mg by mouth daily.   SIMETHICONE (PHAZYME) 180 MG CAPS     Take 180 mg by mouth as needed.  Modified Medications   No medications on file  Discontinued Medications   ALBUTEROL (VENTOLIN HFA) 108 (90 BASE) MCG/ACT INHALER    Inhale 1-2 puffs into the lungs every 6 (six) hours as needed for wheezing or shortness of breath. And or cough.   AMPHETAMINE-DEXTROAMPHETAMINE (ADDERALL) 10 MG TABLET    Take 10 mg by mouth daily.   BENZONATATE (TESSALON) 200 MG CAPSULE    Take 1 capsule (200 mg total) by mouth 2 (two) times daily as needed for cough.   PHENTERMINE (ADIPEX-P) 37.5 MG TABLET    phentermine 37.5 mg tablet  Take 1 tablet every day by oral route in the morning.   SERTRALINE (ZOLOFT) 50 MG TABLET    Take 50 mg by mouth daily.    Physical Exam:  Vitals:   10/20/22 1309  BP: 124/82  Pulse: 80  Resp: 20  Temp: (!) 96.8 F (36 C)  SpO2: 98%  Weight: 182 lb 9.6 oz (82.8 kg)  Height: _0  (1.702 m)   Body mass index is 28.6 kg/m. Wt Readings from Last 3 Encounters:  10/20/22 182 lb 9.6 oz (82.8 kg)  04/29/22 182 lb (82.6 kg)  12/18/21 185 lb (83.9 kg)    Physical Exam Constitutional:      General: She is not in acute distress.    Appearance: She is well-developed. She is not diaphoretic.  HENT:     Head: Normocephalic and atraumatic.     Mouth/Throat:     Pharynx: No oropharyngeal exudate.  Eyes:     Conjunctiva/sclera: Conjunctivae normal.     Pupils: Pupils are equal, round, and reactive to light.  Cardiovascular:     Rate and Rhythm: Normal rate and regular rhythm.     Heart sounds: Normal heart sounds.  Pulmonary:     Effort: Pulmonary effort is normal.     Breath sounds: Normal breath sounds.  Abdominal:     General: Bowel sounds are normal.     Palpations: Abdomen is soft.  Musculoskeletal:     Cervical back: Normal range of motion and neck supple.     Right lower leg: No edema.     Left lower leg: No edema.  Skin:    General: Skin is warm and dry.  Neurological:     Mental Status: She is alert and oriented to  person, place, and time.  Psychiatric:        Mood and Affect: Mood normal.     Labs reviewed: Basic Metabolic Panel: No results for input(s): "NA", "K", "CL", "CO2", "GLUCOSE", "BUN", "CREATININE", "CALCIUM", "MG", "PHOS", "TSH" in the last 8760 hours. Liver Function Tests: No results for input(s): "AST", "ALT", "ALKPHOS", "BILITOT", "PROT", "ALBUMIN" in the last 8760 hours. No results for input(s): "LIPASE", "AMYLASE"  in the last 8760 hours. No results for input(s): "AMMONIA" in the last 8760 hours. CBC: Recent Labs    12/18/21 1445  WBC 8.1  HGB 12.8  HCT 37.6  MCV 84  PLT 253   Lipid Panel: No results for input(s): "CHOL", "HDL", "LDLCALC", "TRIG", "CHOLHDL", "LDLDIRECT" in the last 8760 hours. TSH: No results for input(s): "TSH" in the last 8760 hours. A1C: Lab Results  Component Value Date   HGBA1C 5.7 10/13/2018     Assessment/Plan 1. Encounter for colorectal cancer screening - Ambulatory referral to Gastroenterology  2. Irritable bowel syndrome with diarrhea Ongoing, on florastor Increase the florastor to twice daily  Increase benefiber or metamucil 1-2 times daily  - Ambulatory referral to Gastroenterology - CMP with eGFR([facility]) - CBC with Differential/Platelet  3. History of Helicobacter pylori infection She has been treated but diarrhea persist.  - Ambulatory referral to Gastroenterology  4. Family history of early CAD - Lipid panel  5. Aortic atherosclerosis ([place]) Recommended to take ASA, will follow up lipids and prescribe statin in indicated.    Return in about 6 weeks (around 12/01/2022) for CPE with EKG. Carlos American. [place], [facility] [phone]

## 2022-10-20 NOTE — Patient Instructions (Addendum)
Increase the florastor to twice daily  Increase benefiber or metamucil 1-2 times daily

## 2022-10-22 ENCOUNTER — Other Ambulatory Visit: Payer: Self-pay

## 2022-10-22 DIAGNOSIS — E7800 Pure hypercholesterolemia, unspecified: Secondary | ICD-10-CM

## 2022-10-24 ENCOUNTER — Telehealth: Payer: Self-pay | Admitting: Gastroenterology

## 2022-10-24 NOTE — Telephone Encounter (Signed)
Hi Dr. Lavon Paganini,    We received a referral for this patient to encounter for colorectal cancer screening, irritable bowel syndrome with diarrhea and history of Helicobacter pylori infection. Patient would like to transfer her care over to you because she now lives in [place]. Patient had GI history with [place] [facility]. Records are in Epic for review. Please advise on scheduling.    Thank you

## 2022-10-24 NOTE — Telephone Encounter (Signed)
Ok, please schedule next available appointment.  

## 2022-10-27 NOTE — Telephone Encounter (Signed)
I called patient to schedule but I was unable to speak with patient. Left patient a detailed message for patient to call back to schedule.

## 2022-10-29 NOTE — Telephone Encounter (Signed)
I called patient to schedule but I was unable to speak with patient. Left patient a detailed message for patient to call back to schedule.  

## 2022-11-17 ENCOUNTER — Ambulatory Visit: Payer: 59 | Admitting: Nurse Practitioner

## 2022-11-26 ENCOUNTER — Other Ambulatory Visit: Payer: 59

## 2022-11-28 ENCOUNTER — Ambulatory Visit: Payer: 59 | Admitting: Nurse Practitioner

## 2022-12-09 ENCOUNTER — Telehealth: Payer: Self-pay | Admitting: Gastroenterology

## 2022-12-09 NOTE — Telephone Encounter (Signed)
Ok, thanks.

## 2022-12-09 NOTE — Telephone Encounter (Signed)
Hi Dr Lavon Paganini,    I spoke with patient today regarding the transfer of care we have processed. Patient state she has an appointment for a physical in January and would like to schedule after the Physical exam. Patient states se will call back then to schedule an appointment with [place].    Thank you

## 2023-01-08 ENCOUNTER — Emergency Department (HOSPITAL_COMMUNITY): Payer: No Typology Code available for payment source

## 2023-01-08 ENCOUNTER — Emergency Department (HOSPITAL_COMMUNITY)
Admission: EM | Admit: 2023-01-08 | Discharge: 2023-01-09 | Discharge status: Against Medical Advice | Payer: No Typology Code available for payment source | Attending: Emergency Medicine | Admitting: Emergency Medicine

## 2023-01-08 ENCOUNTER — Other Ambulatory Visit: Payer: Self-pay

## 2023-01-08 ENCOUNTER — Encounter (HOSPITAL_COMMUNITY): Payer: Self-pay

## 2023-01-08 DIAGNOSIS — R079 Chest pain, unspecified: Secondary | ICD-10-CM | POA: Diagnosis present

## 2023-01-08 DIAGNOSIS — R0602 Shortness of breath: Secondary | ICD-10-CM | POA: Insufficient documentation

## 2023-01-08 DIAGNOSIS — Z5321 Procedure and treatment not carried out due to patient leaving prior to being seen by health care provider: Secondary | ICD-10-CM | POA: Diagnosis not present

## 2023-01-08 LAB — BASIC METABOLIC PANEL
Anion gap: 8 (ref 5–15)
BUN: 12 mg/dL (ref 6–20)
CO2: 26 mmol/L (ref 22–32)
Calcium: 9.5 mg/dL (ref 8.9–10.3)
Chloride: 103 mmol/L (ref 98–111)
Creatinine, Ser: 0.97 mg/dL (ref 0.44–1.00)
GFR, Estimated: >60 mL/min (ref >60)
Glucose, Bld: 95 mg/dL (ref 70–99)
Potassium: 3.9 mmol/L (ref 3.5–5.1)
Sodium: 137 mmol/L (ref 135–145)

## 2023-01-08 LAB — CBC
HCT: 37.5 % (ref 36.0–46.0)
Hemoglobin: 12.6 g/dL (ref 12.0–15.0)
MCH: 28.5 pg (ref 26.0–34.0)
MCHC: 33.6 g/dL (ref 30.0–36.0)
MCV: 84.8 fL (ref 80.0–100.0)
Platelets: 272 10*3/uL (ref 150–400)
RBC: 4.42 MIL/uL (ref 3.87–5.11)
RDW: 13.1 % (ref 11.5–15.5)
WBC: 7.7 10*3/uL (ref 4.0–10.5)
nRBC: 0 % (ref 0.0–0.2)

## 2023-01-08 LAB — TROPONIN I (HIGH SENSITIVITY)
Troponin I (High Sensitivity): 5 ng/L (ref <18)
Troponin I (High Sensitivity): 4 ng/L (ref <18)

## 2023-01-08 NOTE — ED Notes (Signed)
Pt stated she was feeling better and she does not want to wait any longer. Pt seen leaving the ED.

## 2023-01-08 NOTE — ED Triage Notes (Signed)
Pt c/o non radiating left sided chest pain. Pt states if she puts her head below her heart then her head starts hurtingx1wk. Pt c/o SOB. Pt denies N/V.
